# Patient Record
Sex: Female | Born: 1975 | Race: White | Hispanic: No | Smoking: Never smoker
Health system: Southern US, Community
[De-identification: ages and names within clinical notes are randomized; demographics above are authoritative.]

## PROBLEM LIST (undated history)

## (undated) DIAGNOSIS — J302 Other seasonal allergic rhinitis: Secondary | ICD-10-CM

## (undated) DIAGNOSIS — D649 Anemia, unspecified: Secondary | ICD-10-CM

## (undated) DIAGNOSIS — K219 Gastro-esophageal reflux disease without esophagitis: Secondary | ICD-10-CM

## (undated) DIAGNOSIS — Z8719 Personal history of other diseases of the digestive system: Secondary | ICD-10-CM

## (undated) DIAGNOSIS — Z973 Presence of spectacles and contact lenses: Secondary | ICD-10-CM

## (undated) DIAGNOSIS — D259 Leiomyoma of uterus, unspecified: Secondary | ICD-10-CM

## (undated) DIAGNOSIS — M1990 Unspecified osteoarthritis, unspecified site: Secondary | ICD-10-CM

## (undated) DIAGNOSIS — O021 Missed abortion: Secondary | ICD-10-CM

## (undated) DIAGNOSIS — K859 Acute pancreatitis without necrosis or infection, unspecified: Secondary | ICD-10-CM

## (undated) DIAGNOSIS — O039 Complete or unspecified spontaneous abortion without complication: Secondary | ICD-10-CM

## (undated) DIAGNOSIS — N809 Endometriosis, unspecified: Secondary | ICD-10-CM

## (undated) HISTORY — PX: ABDOMINAL HYSTERECTOMY: SHX81

---
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
## 1997-11-03 ENCOUNTER — Ambulatory Visit (HOSPITAL_COMMUNITY): Admission: RE | Admit: 1997-11-03 | Discharge: 1997-11-03 | Payer: Self-pay | Admitting: Internal Medicine

## 1998-08-26 ENCOUNTER — Ambulatory Visit (HOSPITAL_COMMUNITY): Admission: RE | Admit: 1998-08-26 | Discharge: 1998-08-26 | Payer: Self-pay | Admitting: Oral & Maxillofacial Surgery

## 1998-08-26 ENCOUNTER — Encounter: Payer: Self-pay | Admitting: Oral & Maxillofacial Surgery

## 2000-03-04 ENCOUNTER — Other Ambulatory Visit: Admission: RE | Admit: 2000-03-04 | Discharge: 2000-03-04 | Payer: Self-pay | Admitting: *Deleted

## 2000-11-18 ENCOUNTER — Emergency Department (HOSPITAL_COMMUNITY): Admission: EM | Admit: 2000-11-18 | Discharge: 2000-11-19 | Payer: Self-pay | Admitting: Internal Medicine

## 2001-06-16 ENCOUNTER — Other Ambulatory Visit: Admission: RE | Admit: 2001-06-16 | Discharge: 2001-06-16 | Payer: Self-pay | Admitting: Obstetrics and Gynecology

## 2002-06-16 ENCOUNTER — Other Ambulatory Visit: Admission: RE | Admit: 2002-06-16 | Discharge: 2002-06-16 | Payer: Self-pay | Admitting: Obstetrics and Gynecology

## 2003-06-17 ENCOUNTER — Other Ambulatory Visit: Admission: RE | Admit: 2003-06-17 | Discharge: 2003-06-17 | Payer: Self-pay | Admitting: Obstetrics and Gynecology

## 2004-08-03 ENCOUNTER — Other Ambulatory Visit: Admission: RE | Admit: 2004-08-03 | Discharge: 2004-08-03 | Payer: Self-pay | Admitting: Obstetrics and Gynecology

## 2004-10-05 ENCOUNTER — Emergency Department (HOSPITAL_COMMUNITY): Admission: EM | Admit: 2004-10-05 | Discharge: 2004-10-05 | Payer: Self-pay | Admitting: Emergency Medicine

## 2004-10-06 ENCOUNTER — Encounter: Admission: RE | Admit: 2004-10-06 | Discharge: 2004-10-06 | Payer: Self-pay | Admitting: Gastroenterology

## 2004-10-10 ENCOUNTER — Encounter: Admission: RE | Admit: 2004-10-10 | Discharge: 2004-10-10 | Payer: Self-pay | Admitting: Gastroenterology

## 2004-11-06 ENCOUNTER — Encounter (INDEPENDENT_AMBULATORY_CARE_PROVIDER_SITE_OTHER): Payer: Self-pay | Admitting: Specialist

## 2004-11-06 ENCOUNTER — Ambulatory Visit (HOSPITAL_COMMUNITY): Admission: RE | Admit: 2004-11-06 | Discharge: 2004-11-07 | Payer: Self-pay | Admitting: General Surgery

## 2004-11-06 HISTORY — PX: CHOLECYSTECTOMY: SHX55

## 2005-08-07 ENCOUNTER — Other Ambulatory Visit: Admission: RE | Admit: 2005-08-07 | Discharge: 2005-08-07 | Payer: Self-pay | Admitting: Obstetrics and Gynecology

## 2006-08-15 ENCOUNTER — Other Ambulatory Visit: Admission: RE | Admit: 2006-08-15 | Discharge: 2006-08-15 | Payer: Self-pay | Admitting: Obstetrics and Gynecology

## 2007-08-26 ENCOUNTER — Other Ambulatory Visit: Admission: RE | Admit: 2007-08-26 | Discharge: 2007-08-26 | Payer: Self-pay | Admitting: Obstetrics and Gynecology

## 2008-09-03 ENCOUNTER — Ambulatory Visit: Payer: Self-pay | Admitting: Obstetrics and Gynecology

## 2008-09-03 ENCOUNTER — Encounter: Payer: Self-pay | Admitting: Obstetrics and Gynecology

## 2008-09-03 ENCOUNTER — Other Ambulatory Visit: Admission: RE | Admit: 2008-09-03 | Discharge: 2008-09-03 | Payer: Self-pay | Admitting: Obstetrics and Gynecology

## 2009-09-19 ENCOUNTER — Ambulatory Visit: Payer: Self-pay | Admitting: Obstetrics and Gynecology

## 2009-09-19 ENCOUNTER — Other Ambulatory Visit: Admission: RE | Admit: 2009-09-19 | Discharge: 2009-09-19 | Payer: Self-pay | Admitting: Obstetrics and Gynecology

## 2010-02-27 ENCOUNTER — Ambulatory Visit: Payer: Self-pay | Admitting: Obstetrics and Gynecology

## 2010-06-04 ENCOUNTER — Encounter: Payer: Self-pay | Admitting: Gastroenterology

## 2010-08-07 ENCOUNTER — Emergency Department: Payer: Self-pay | Admitting: Unknown Physician Specialty

## 2010-09-21 ENCOUNTER — Other Ambulatory Visit (HOSPITAL_COMMUNITY)
Admission: RE | Admit: 2010-09-21 | Discharge: 2010-09-21 | Disposition: A | Discharge status: Home / Self Care | Payer: BC Managed Care – PPO | Source: Ambulatory Visit | Attending: Obstetrics and Gynecology | Admitting: Obstetrics and Gynecology

## 2010-09-21 ENCOUNTER — Other Ambulatory Visit: Payer: Self-pay | Admitting: Obstetrics and Gynecology

## 2010-09-21 ENCOUNTER — Encounter (INDEPENDENT_AMBULATORY_CARE_PROVIDER_SITE_OTHER): Payer: BC Managed Care – PPO | Admitting: Obstetrics and Gynecology

## 2010-09-21 DIAGNOSIS — Z01419 Encounter for gynecological examination (general) (routine) without abnormal findings: Secondary | ICD-10-CM

## 2010-09-21 DIAGNOSIS — Z124 Encounter for screening for malignant neoplasm of cervix: Secondary | ICD-10-CM | POA: Insufficient documentation

## 2010-09-29 NOTE — Op Note (Signed)
NAME:  Cathy, [name] NO.:  [id number]   MEDICAL RECORD NO.:  [id number]          PATIENT TYPE:  OIB   LOCATION:  2550                         FACILITY:  [facility]   PHYSICIAN:  Cherylynn Ridges, M.D.    DATE OF BIRTH:  1975-12-11   DATE OF PROCEDURE:  11/06/2004  DATE OF DISCHARGE:                                 OPERATIVE REPORT   PREOPERATIVE DIAGNOSIS:  Dysfunctional gallbladder with symptoms.   POSTOPERATIVE DIAGNOSES:  Dysfunctional gallbladder with symptoms with  adhesions to the right colon and right lower quadrant and normal appendix.   PROCEDURE:  Laparoscopic cholecystectomy with laparoscopic enterolysis.   SURGEON:  Cherylynn Ridges, M.D.   ASSISTANT:  Earney Hamburg, P.A.   ANESTHESIA:  General endotracheal.   ESTIMATED BLOOD LOSS:  Less than 20 cc.   COMPLICATIONS:  None.   CONDITION:  Good.   INDICATION FOR OPERATION:  The patient is a 35 year old with abdominal pain  in the epigastrium and right upper quadrant with an ejection fraction of 58%  who now comes in for laparoscopic cholecystectomy.   FINDINGS:  The patient had some filmy adhesions to the colon on the right  side.  The appendix was normal.  There were some adhesions to the  infundibulum of the gallbladder.  Cholangiogram was normal with no filling  defects and no dilatation.   DESCRIPTION OF OPERATION:  The patient was taken to the operating room and  placed on the table in the supine position.  After an adequate endotracheal  anesthetic was administered, she was prepped and draped in the usual sterile  manner, exposing the midline and the right upper quadrant.   The patient had had an umbilical piercing and had a hole in place from that.  This was in the superior aspect of her umbilicus.  We used an inferior  infraumbilical curvilinear incision down to the midline fascia.  It was  through that fascia that we entered the peritoneal cavity with Kocher clamps  and subsequently a  Hasson cannula.  This was secured in place with a purse-  string suture of 0 Vicryl.  Once that was in place two right costal margin 5-  mm cannulas and a subxiphoid cannula were passed under direct vision.  The  patient was placed in reversed Trendelenburg, the left side was tilted down  and dissection begun.   We were able to grasp the dome of the gallbladder and retract towards the  right upper quadrant and the anterior abdominal wall.  A dissecting grasper  was passed onto the infundibulum.  We were able to dissect out the perineum  overlying the triangle of Calot and the hepatoduodenal triangle.  This  exposed the cystic duct and the cystic artery.  A clip was placed along the  gallbladder side of the cystic duct and subsequently a cholecystoductotomy  made using laparoscopic scissors.  With this being done, a Cook catheter  which had been passed through the anterior abdominal wall was passed into  the cholecystoductotomy, clipped in place and then subsequently a  cholangiogram was performed which showed  normal filling of the biliary tree,  good emptying into the duodenum and good proximal flow.  There were no  filling defects.   Once the cholangiogram was completed, we removed the [facility] catheter and the  Endoclip.  We doubly clipped the cystic duct distally and transected it.  The cystic artery with the cystic duct was clipped proximally x2 and then  distally x1 and transected.  We then dissected out the gallbladder from the  hepatic bed with minimal difficulty and minimal bleeding.  Once this was  done, we were able to bring it out through the supraumbilical site with no  difficulty.  We closed the fascia at that site using the purse-string suture  of 0 Vicryl.  Once that was done, we inspected the bed for bleeding and bile  staining and none was noted.  We aspirated all gas and fluid from the  peritoneal cavity and removed all cannulas.   0.25% Marcaine with epinephrine was  injected at all sites and the skin was  closed using a running, subcuticular stitch of 4-0 Vicryl.  All needle  counts, sponge counts and instrument counts were correct, and sterile  dressings were applied.       JOW/MEDQ  D:  11/06/2004  T:  11/06/2004  Job:  [id number]

## 2011-12-12 ENCOUNTER — Encounter (HOSPITAL_COMMUNITY): Payer: Self-pay

## 2011-12-12 ENCOUNTER — Inpatient Hospital Stay (HOSPITAL_COMMUNITY)
Admission: AD | Admit: 2011-12-12 | Discharge: 2011-12-12 | Disposition: A | Discharge status: Home / Self Care | Payer: 59 | Source: Ambulatory Visit | Attending: Obstetrics and Gynecology | Admitting: Obstetrics and Gynecology

## 2011-12-12 DIAGNOSIS — IMO0002 Reserved for concepts with insufficient information to code with codable children: Secondary | ICD-10-CM

## 2011-12-12 DIAGNOSIS — O139 Gestational [pregnancy-induced] hypertension without significant proteinuria, unspecified trimester: Secondary | ICD-10-CM | POA: Insufficient documentation

## 2011-12-12 LAB — CBC
MCH: 32.8 pg (ref 26.0–34.0)
MCV: 97.6 fL (ref 78.0–100.0)
Platelets: 201 10*3/uL (ref 150–400)
RDW: 13.2 % (ref 11.5–15.5)
WBC: 7.3 10*3/uL (ref 4.0–10.5)

## 2011-12-12 LAB — URINALYSIS, ROUTINE W REFLEX MICROSCOPIC
Glucose, UA: NEGATIVE mg/dL
Ketones, ur: NEGATIVE mg/dL
Leukocytes, UA: NEGATIVE
Nitrite: NEGATIVE
Specific Gravity, Urine: >1.03 — ABNORMAL HIGH (ref 1.005–1.030)
pH: 6 (ref 5.0–8.0)

## 2011-12-12 LAB — OB RESULTS CONSOLE HIV ANTIBODY (ROUTINE TESTING): HIV: NONREACTIVE

## 2011-12-12 LAB — COMPREHENSIVE METABOLIC PANEL
ALT: 22 U/L (ref 0–35)
AST: 29 U/L (ref 0–37)
Albumin: 2.7 g/dL — ABNORMAL LOW (ref 3.5–5.2)
Calcium: 8.8 mg/dL (ref 8.4–10.5)
Creatinine, Ser: 0.77 mg/dL (ref 0.50–1.10)
Sodium: 133 mEq/L — ABNORMAL LOW (ref 135–145)

## 2011-12-12 LAB — OB RESULTS CONSOLE GC/CHLAMYDIA
Chlamydia: NEGATIVE
Gonorrhea: NEGATIVE

## 2011-12-12 LAB — OB RESULTS CONSOLE HEPATITIS B SURFACE ANTIGEN: Hepatitis B Surface Ag: NEGATIVE

## 2011-12-12 LAB — OB RESULTS CONSOLE ABO/RH: RH Type: POSITIVE

## 2011-12-12 LAB — OB RESULTS CONSOLE RUBELLA ANTIBODY, IGM: Rubella: IMMUNE

## 2011-12-12 LAB — LACTATE DEHYDROGENASE: LDH: 249 U/L (ref 94–250)

## 2011-12-12 MED ORDER — GI COCKTAIL ~~LOC~~
30.0000 mL | Freq: Once | ORAL | Status: AC
  Administered 2011-12-12: 30 mL via ORAL
  Filled 2011-12-12: qty 30

## 2011-12-12 MED ORDER — RANITIDINE HCL 150 MG PO TABS: 150.0000 mg | ORAL_TABLET | Freq: Two times a day (BID) | ORAL | Status: DC

## 2011-12-12 NOTE — MAU Note (Signed)
Patient states she had a regular office visit today and her blood pressure was elevated,. Patient states she was seeing spots on 7-28 but none since. Had epigastric tenderness on palpation in the office and has been having increased swelling. Denies any bleeding or leaking and reports good fetal movement.

## 2011-12-12 NOTE — MAU Provider Note (Signed)
History     CSN: [id number]  Arrival date and time: 12/12/11 1725   First Provider Initiated Contact with Patient 12/12/11 1835      Chief Complaint  Patient presents with  . Hypertension   HPI This is a 36 year old G1 P0 at 30 weeks and 5 days who was sent to the MAU from [facility] office due to elevated blood pressure and proteinuria.  She's had had a couple days ago as well as scotoma. She also has mild epigastric pain. She does have swelling in her legs. She denies contractions, decreased fetal activity, leaking fluid, vaginal discharge.  OB History    Grav Para Term Preterm Abortions TAB SAB Ect Mult Living   1 0 0 0 0 0 0 0 0 0       History reviewed. No pertinent past medical history.  Past Surgical History  Procedure Date  . Cholecystectomy 2006  . Laparoscopic endometriosis fulguration 1997    No family history on file.  History  Substance Use Topics  . Smoking status: Not on file  . Smokeless tobacco: Not on file  . Alcohol Use:     Allergies:  Allergies  Allergen Reactions  . Sulfa Antibiotics Nausea And Vomiting    Prescriptions prior to admission  Medication Sig Dispense Refill  . Multiple Vitamin (MULTIVITAMIN) tablet Take 1 tablet by mouth daily.      . Pediatric Multiple Vit-C-FA (FLINSTONES GUMMIES OMEGA-3 DHA) CHEW Chew by mouth.        ROS Physical Exam   Blood pressure 136/79, pulse 86, temperature 99.2 F (37.3 C), temperature source Oral, resp. rate 18, height 5\' 3"  (1.6 m), weight 82.101 kg (181 lb), SpO2 99.00%.  Physical Exam  Constitutional: She is oriented to person, place, and time. She appears well-developed and well-nourished.  HENT:  Head: Normocephalic and atraumatic.  GI: Soft. She exhibits no distension and no mass. There is no rebound and no guarding.       Fundal height at term. Mild epigastric tenderness  Musculoskeletal: She exhibits edema (2+ pitting edema enlarged ovaries.).  Neurological: She is alert and oriented to  person, place, and time.       Two beat clonus bilaterally.  Skin: Skin is warm and dry.  Psychiatric: She has a normal mood and affect. Her behavior is normal. Judgment and thought content normal.   Results for orders placed during the hospital encounter of 12/12/11 (from the past 24 hour(s))  OB RESULTS CONSOLE GBS     Status: Normal      Component Value Range   GBS Positive    OB RESULTS CONSOLE GC/CHLAMYDIA     Status: Normal      Component Value Range   Gonorrhea Negative     Chlamydia Negative    OB RESULTS CONSOLE RPR     Status: Normal      Component Value Range   RPR Nonreactive    OB RESULTS CONSOLE HIV ANTIBODY (ROUTINE TESTING)     Status: Normal      Component Value Range   HIV Non-reactive    OB RESULTS CONSOLE RUBELLA ANTIBODY, IGM     Status: Normal      Component Value Range   Rubella Immune    OB RESULTS CONSOLE HEPATITIS B SURFACE ANTIGEN     Status: Normal      Component Value Range   Hepatitis B Surface Ag Negative    OB RESULTS CONSOLE ABO/RH     Status: Normal  Component Value Range   RH Type  Positive     ABO Grouping O    URINALYSIS, ROUTINE W REFLEX MICROSCOPIC     Status: Abnormal   Collection Time   12/12/11  5:50 PM      Component Value Range   Color, Urine YELLOW  YELLOW   APPearance CLEAR  CLEAR   Specific Gravity, Urine >1.030 (*) 1.005 - 1.030   pH 6.0  5.0 - 8.0   Glucose, UA NEGATIVE  NEGATIVE mg/dL   Hgb urine dipstick NEGATIVE  NEGATIVE   Bilirubin Urine NEGATIVE  NEGATIVE   Ketones, ur NEGATIVE  NEGATIVE mg/dL   Protein, ur NEGATIVE  NEGATIVE mg/dL   Urobilinogen, UA 0.2  0.0 - 1.0 mg/dL   Nitrite NEGATIVE  NEGATIVE   Leukocytes, UA NEGATIVE  NEGATIVE  CBC     Status: Abnormal   Collection Time   12/12/11  6:40 PM      Component Value Range   WBC 7.3  4.0 - 10.5 K/uL   RBC 3.69 (*) 3.87 - 5.11 MIL/uL   Hemoglobin 12.1  12.0 - 15.0 g/dL   HCT 16.1  09.6 - 04.5 %   MCV 97.6  78.0 - 100.0 fL   MCH 32.8  26.0 - 34.0 pg   MCHC  33.6  30.0 - 36.0 g/dL   RDW 40.9  81.1 - 91.4 %   Platelets 201  150 - 400 K/uL  COMPREHENSIVE METABOLIC PANEL     Status: Abnormal   Collection Time   12/12/11  6:40 PM      Component Value Range   Sodium 133 (*) 135 - 145 mEq/L   Potassium 3.6  3.5 - 5.1 mEq/L   Chloride 101  96 - 112 mEq/L   CO2 22  19 - 32 mEq/L   Glucose, Bld 89  70 - 99 mg/dL   BUN 9  6 - 23 mg/dL   Creatinine, Ser 7.82  0.50 - 1.10 mg/dL   Calcium 8.8  8.4 - 95.6 mg/dL   Total Protein 6.5  6.0 - 8.3 g/dL   Albumin 2.7 (*) 3.5 - 5.2 g/dL   AST 29  0 - 37 U/L   ALT 22  0 - 35 U/L   Alkaline Phosphatase 276 (*) 39 - 117 U/L   Total Bilirubin 0.3  0.3 - 1.2 mg/dL   GFR calc non Af Amer >90  >90 mL/min   GFR calc Af Amer >90  >90 mL/min  URIC ACID     Status: Normal   Collection Time   12/12/11  6:40 PM      Component Value Range   Uric Acid, Serum 4.4  2.4 - 7.0 mg/dL  LACTATE DEHYDROGENASE     Status: Normal   Collection Time   12/12/11  6:40 PM      Component Value Range   LDH 249  94 - 250 U/L    MAU Course  Procedures NST shows category 1 tracing with multiple accelerations. MDM Labs negative for frequency.  Assessment and Plan  #1 gestational hypertension.  Discuss with Dr. Rana Snare negative labs. Patient to followup at [facility] office on Friday for blood pressure check, ultrasound, NST. Preeclampsia signs and symptoms given. Patient discharged home  Cathy [name] JEHIEL 12/12/2011, 6:48 PM

## 2011-12-12 NOTE — MAU Note (Signed)
Pt presents for preeclampsia evaluation after at [facility] with recent c/o headache, dizziness and blurry vision on Sunday.  Pt with epigastic pain with fetal movement and when palpated by MD today. Pt also with acid refulx and heartburn unrelieved by tums.

## 2011-12-16 ENCOUNTER — Inpatient Hospital Stay (HOSPITAL_COMMUNITY)
Admission: AD | Admit: 2011-12-16 | Discharge: 2011-12-19 | DRG: 766 | Disposition: A | Discharge status: Home / Self Care | Payer: 59 | Source: Ambulatory Visit | Attending: Obstetrics and Gynecology | Admitting: Obstetrics and Gynecology

## 2011-12-16 ENCOUNTER — Encounter (HOSPITAL_COMMUNITY): Payer: Self-pay | Admitting: *Deleted

## 2011-12-16 DIAGNOSIS — O99892 Other specified diseases and conditions complicating childbirth: Secondary | ICD-10-CM | POA: Diagnosis present

## 2011-12-16 DIAGNOSIS — O139 Gestational [pregnancy-induced] hypertension without significant proteinuria, unspecified trimester: Principal | ICD-10-CM | POA: Diagnosis present

## 2011-12-16 DIAGNOSIS — Z22330 Carrier of Group B streptococcus: Secondary | ICD-10-CM

## 2011-12-16 DIAGNOSIS — O26899 Other specified pregnancy related conditions, unspecified trimester: Secondary | ICD-10-CM

## 2011-12-16 LAB — COMPREHENSIVE METABOLIC PANEL
AST: 22 U/L (ref 0–37)
Albumin: 2.5 g/dL — ABNORMAL LOW (ref 3.5–5.2)
Calcium: 8.8 mg/dL (ref 8.4–10.5)
Chloride: 103 mEq/L (ref 96–112)
Creatinine, Ser: 0.75 mg/dL (ref 0.50–1.10)
Sodium: 135 mEq/L (ref 135–145)
Total Bilirubin: 0.2 mg/dL — ABNORMAL LOW (ref 0.3–1.2)

## 2011-12-16 LAB — URINALYSIS, ROUTINE W REFLEX MICROSCOPIC
Bilirubin Urine: NEGATIVE
Leukocytes, UA: NEGATIVE
Nitrite: NEGATIVE
Specific Gravity, Urine: <1.005 — ABNORMAL LOW (ref 1.005–1.030)
Urobilinogen, UA: 0.2 mg/dL (ref 0.0–1.0)

## 2011-12-16 LAB — CBC
MCH: 33 pg (ref 26.0–34.0)
Platelets: 190 10*3/uL (ref 150–400)
RBC: 3.67 MIL/uL — ABNORMAL LOW (ref 3.87–5.11)

## 2011-12-16 LAB — LACTATE DEHYDROGENASE: LDH: 212 U/L (ref 94–250)

## 2011-12-16 MED ORDER — OXYCODONE-ACETAMINOPHEN 5-325 MG PO TABS
2.0000 | ORAL_TABLET | Freq: Once | ORAL | Status: AC
  Administered 2011-12-16: 2 via ORAL
  Filled 2011-12-16: qty 2

## 2011-12-16 MED ORDER — GI COCKTAIL ~~LOC~~
30.0000 mL | Freq: Once | ORAL | Status: AC
  Administered 2011-12-16: 30 mL via ORAL
  Filled 2011-12-16: qty 30

## 2011-12-16 NOTE — MAU Provider Note (Signed)
History     CSN: [id number]  Arrival date and time: 12/16/11 8295   First Provider Initiated Contact with Patient 12/16/11 2023      Chief Complaint  Patient presents with  . Decreased Fetal Movement   HPI Cathy [name] is a 36 y.o. female @ [redacted]w[redacted]d gestation who presents to MAU for elevated blood pressure. Patient states that when she was in the office last week her pressure was up and sent to MAU and had PIH labs that were ok so she went home. Returned to the office 2 days ago and BP was still elevated. Told possible mild PIH. Scheduled for induction this week. Patient has been on bedrest. Today she has had nausea, headache and pain in abdomen, BP continued to be elevated. Patient has felt like fetal movement is decreased. The history was provided by the patient.  OB History    Grav Para Term Preterm Abortions TAB SAB Ect Mult Living   1 0 0 0 0 0 0 0 0 0       No past medical history on file.  Past Surgical History  Procedure Date  . Cholecystectomy 2006  . Laparoscopic endometriosis fulguration 1997    No family history on file.  History  Substance Use Topics  . Smoking status: Not on file  . Smokeless tobacco: Not on file  . Alcohol Use: No    Allergies:  Allergies  Allergen Reactions  . Sulfa Antibiotics Nausea And Vomiting    Prescriptions prior to admission  Medication Sig Dispense Refill  . Multiple Vitamin (MULTIVITAMIN) tablet Take 1 tablet by mouth daily.      . Pediatric Multiple Vit-C-FA (FLINSTONES GUMMIES OMEGA-3 DHA) CHEW Chew by mouth.      . ranitidine (ZANTAC) 150 MG tablet Take 1 tablet (150 mg total) by mouth 2 (two) times daily.  60 tablet  0    Review of Systems  Constitutional: Negative for fever, chills and weight loss.  HENT: Positive for ear pain. Negative for nosebleeds, congestion, sore throat and neck pain.   Eyes: Negative for blurred vision, double vision, photophobia and pain.  Respiratory: Negative for cough, shortness of breath  and wheezing.   Cardiovascular: Positive for leg swelling. Negative for chest pain and palpitations.  Gastrointestinal: Positive for heartburn, nausea and abdominal pain. Negative for vomiting, diarrhea and constipation.  Genitourinary: Negative for dysuria, urgency and frequency.  Musculoskeletal: Positive for myalgias and back pain.  Skin: Negative for itching and rash.  Neurological: Positive for dizziness and headaches. Negative for sensory change, speech change, seizures and weakness.  Endo/Heme/Allergies: Does not bruise/bleed easily.  Psychiatric/Behavioral: Negative for depression. The patient is not nervous/anxious and does not have insomnia.    Blood pressure 134/90, pulse 89, temperature 97.2 F (36.2 C), temperature source Oral, resp. rate 20, height 5\' 3"  (1.6 m), weight 181 lb (82.101 kg).  Physical Exam  Nursing note and vitals reviewed. Constitutional: She is oriented to person, place, and time. She appears well-developed and well-nourished. No distress.       Blood pressure elevated on arrival.  HENT:  Head: Normocephalic and atraumatic.  Eyes: EOM are normal.  Neck: Neck supple.  Cardiovascular: Normal rate.   Respiratory: Effort normal.  GI: Soft. There is tenderness in the right upper quadrant. There is no rebound, no guarding and no CVA tenderness.       Gravid, consistent with dates. Mildly tender at umbilicus with palpation.    Genitourinary: Vagina normal.  Musculoskeletal: Normal range  of motion.  Neurological: She is alert and oriented to person, place, and time.  Skin: Skin is warm and dry.  Psychiatric: She has a normal mood and affect. Her behavior is normal. Judgment and thought content normal.   Results for orders placed during the hospital encounter of 12/16/11 (from the past 24 hour(s))  URINALYSIS, ROUTINE W REFLEX MICROSCOPIC     Status: Abnormal   Collection Time   12/16/11  7:45 PM      Component Value Range   Color, Urine YELLOW  YELLOW    APPearance CLEAR  CLEAR   Specific Gravity, Urine <1.005 (*) 1.005 - 1.030   pH 6.0  5.0 - 8.0   Glucose, UA NEGATIVE  NEGATIVE mg/dL   Hgb urine dipstick NEGATIVE  NEGATIVE   Bilirubin Urine NEGATIVE  NEGATIVE   Ketones, ur NEGATIVE  NEGATIVE mg/dL   Protein, ur NEGATIVE  NEGATIVE mg/dL   Urobilinogen, UA 0.2  0.0 - 1.0 mg/dL   Nitrite NEGATIVE  NEGATIVE   Leukocytes, UA NEGATIVE  NEGATIVE  CBC     Status: Abnormal   Collection Time   12/16/11  8:03 PM      Component Value Range   WBC 7.4  4.0 - 10.5 K/uL   RBC 3.67 (*) 3.87 - 5.11 MIL/uL   Hemoglobin 12.1  12.0 - 15.0 g/dL   HCT 16.1 (*) 09.6 - 04.5 %   MCV 97.5  78.0 - 100.0 fL   MCH 33.0  26.0 - 34.0 pg   MCHC 33.8  30.0 - 36.0 g/dL   RDW 40.9  81.1 - 91.4 %   Platelets 190  150 - 400 K/uL  COMPREHENSIVE METABOLIC PANEL     Status: Abnormal   Collection Time   12/16/11  8:03 PM      Component Value Range   Sodium 135  135 - 145 mEq/L   Potassium 3.7  3.5 - 5.1 mEq/L   Chloride 103  96 - 112 mEq/L   CO2 21  19 - 32 mEq/L   Glucose, Bld 82  70 - 99 mg/dL   BUN 7  6 - 23 mg/dL   Creatinine, Ser 7.82  0.50 - 1.10 mg/dL   Calcium 8.8  8.4 - 95.6 mg/dL   Total Protein 6.2  6.0 - 8.3 g/dL   Albumin 2.5 (*) 3.5 - 5.2 g/dL   AST 22  0 - 37 U/L   ALT 16  0 - 35 U/L   Alkaline Phosphatase 264 (*) 39 - 117 U/L   Total Bilirubin 0.2 (*) 0.3 - 1.2 mg/dL   GFR calc non Af Amer >90  >90 mL/min   GFR calc Af Amer >90  >90 mL/min  URIC ACID     Status: Normal   Collection Time   12/16/11  8:03 PM      Component Value Range   Uric Acid, Serum 4.7  2.4 - 7.0 mg/dL  LACTATE DEHYDROGENASE     Status: Normal   Collection Time   12/16/11  8:03 PM      Component Value Range   LDH 212  94 - 250 U/L   EFM: Baseline initially 150, Reactive tracing, uterine irritability,   MAU Course: Dr. Marcelle Overlie gave [facility] lab orders.   Procedures Medical screening exam complete and patient stable for continued care by Dr. Marcelle Overlie  Dr. Marcelle Overlie order GI  Cocktail for patient.  NEESE,HOPE, RN, FNP, [facility] 12/16/2011, 8:24 PM   @ 23:45 pm Dr.  [place] notified by RN that patient continues to have headache and abdominal pain. He will write admit orders and patient will go to Labor/Birth unit.

## 2011-12-16 NOTE — MAU Note (Signed)
Pt reports "my b/p is still up" , states elevated b/p in office since Wednesday. States baby is not moving as much and she just doesn';t feel well.

## 2011-12-16 NOTE — MAU Note (Signed)
"  I feel like crap".

## 2011-12-16 NOTE — MAU Note (Signed)
Sent in by MD for BP check. BP started rising past Wed at MD office. Now with RUQ pain and nausea without vomiting. Generally fatigued. Mild frontal HA , no visual disturbances now but did have previously.

## 2011-12-16 NOTE — MAU Note (Signed)
Pt states HA worsening and nausea and RUQ pain worsening as well.

## 2011-12-17 ENCOUNTER — Inpatient Hospital Stay (HOSPITAL_COMMUNITY): Payer: 59 | Admitting: Anesthesiology

## 2011-12-17 ENCOUNTER — Encounter (HOSPITAL_COMMUNITY): Payer: Self-pay | Admitting: *Deleted

## 2011-12-17 ENCOUNTER — Encounter (HOSPITAL_COMMUNITY): Payer: Self-pay | Admitting: Anesthesiology

## 2011-12-17 ENCOUNTER — Encounter (HOSPITAL_COMMUNITY)
Admission: AD | Disposition: A | Discharge status: Home / Self Care | Payer: Self-pay | Source: Ambulatory Visit | Attending: Obstetrics and Gynecology

## 2011-12-17 LAB — RPR: RPR Ser Ql: NONREACTIVE

## 2011-12-17 SURGERY — Surgical Case
Anesthesia: Regional

## 2011-12-17 MED ORDER — TERBUTALINE SULFATE 1 MG/ML IJ SOLN: 0.2500 mg | Freq: Once | INTRAMUSCULAR | Status: DC | PRN

## 2011-12-17 MED ORDER — SODIUM CHLORIDE 0.9 % IV SOLN
1.0000 ug/kg/h | INTRAVENOUS | Status: DC | PRN
  Filled 2011-12-17: qty 2.5

## 2011-12-17 MED ORDER — ONDANSETRON HCL 4 MG/2ML IJ SOLN: 4.0000 mg | INTRAMUSCULAR | Status: DC | PRN

## 2011-12-17 MED ORDER — TETANUS-DIPHTH-ACELL PERTUSSIS 5-2.5-18.5 LF-MCG/0.5 IM SUSP: 0.5000 mL | Freq: Once | INTRAMUSCULAR | Status: DC

## 2011-12-17 MED ORDER — NALBUPHINE SYRINGE 5 MG/0.5 ML
5.0000 mg | INJECTION | INTRAMUSCULAR | Status: DC | PRN
  Filled 2011-12-17: qty 1

## 2011-12-17 MED ORDER — PENICILLIN G POTASSIUM 5000000 UNITS IJ SOLR
2.5000 10*6.[IU] | INTRAVENOUS | Status: DC
  Filled 2011-12-17 (×4): qty 2.5

## 2011-12-17 MED ORDER — LACTATED RINGERS IV SOLN
INTRAVENOUS | Status: DC
  Administered 2011-12-17 (×3): via INTRAVENOUS

## 2011-12-17 MED ORDER — DIPHENHYDRAMINE HCL 25 MG PO CAPS: 25.0000 mg | ORAL_CAPSULE | Freq: Four times a day (QID) | ORAL | Status: DC | PRN

## 2011-12-17 MED ORDER — IBUPROFEN 600 MG PO TABS
600.0000 mg | ORAL_TABLET | Freq: Four times a day (QID) | ORAL | Status: DC
  Administered 2011-12-17 – 2011-12-19 (×7): 600 mg via ORAL
  Filled 2011-12-17 (×3): qty 1

## 2011-12-17 MED ORDER — KETOROLAC TROMETHAMINE 30 MG/ML IJ SOLN
30.0000 mg | Freq: Four times a day (QID) | INTRAMUSCULAR | Status: AC | PRN
  Administered 2011-12-17: 30 mg via INTRAMUSCULAR

## 2011-12-17 MED ORDER — ONDANSETRON HCL 4 MG/2ML IJ SOLN
INTRAMUSCULAR | Status: AC
  Filled 2011-12-17: qty 2

## 2011-12-17 MED ORDER — FENTANYL CITRATE 0.05 MG/ML IJ SOLN
INTRAMUSCULAR | Status: DC | PRN
  Administered 2011-12-17: 25 ug via INTRATHECAL

## 2011-12-17 MED ORDER — PRENATAL MULTIVITAMIN CH
1.0000 | ORAL_TABLET | Freq: Every day | ORAL | Status: DC
  Administered 2011-12-17 – 2011-12-19 (×3): 1 via ORAL
  Filled 2011-12-17 (×3): qty 1

## 2011-12-17 MED ORDER — SENNOSIDES-DOCUSATE SODIUM 8.6-50 MG PO TABS
2.0000 | ORAL_TABLET | Freq: Every day | ORAL | Status: DC
  Administered 2011-12-17 – 2011-12-18 (×2): 2 via ORAL

## 2011-12-17 MED ORDER — LACTATED RINGERS IV SOLN
500.0000 mL | INTRAVENOUS | Status: DC | PRN
  Administered 2011-12-17: 500 mL via INTRAVENOUS

## 2011-12-17 MED ORDER — SIMETHICONE 80 MG PO CHEW
80.0000 mg | CHEWABLE_TABLET | Freq: Three times a day (TID) | ORAL | Status: DC
  Administered 2011-12-17 – 2011-12-19 (×7): 80 mg via ORAL

## 2011-12-17 MED ORDER — ONDANSETRON HCL 4 MG/2ML IJ SOLN: 4.0000 mg | Freq: Three times a day (TID) | INTRAMUSCULAR | Status: DC | PRN

## 2011-12-17 MED ORDER — MEPERIDINE HCL 25 MG/ML IJ SOLN: 6.2500 mg | INTRAMUSCULAR | Status: DC | PRN

## 2011-12-17 MED ORDER — NALOXONE HCL 0.4 MG/ML IJ SOLN: 0.4000 mg | INTRAMUSCULAR | Status: DC | PRN

## 2011-12-17 MED ORDER — LACTATED RINGERS IV SOLN
INTRAVENOUS | Status: DC | PRN
  Administered 2011-12-17: 11:00:00 via INTRAVENOUS

## 2011-12-17 MED ORDER — METOCLOPRAMIDE HCL 5 MG/ML IJ SOLN: 10.0000 mg | Freq: Three times a day (TID) | INTRAMUSCULAR | Status: DC | PRN

## 2011-12-17 MED ORDER — ZOLPIDEM TARTRATE 5 MG PO TABS: 5.0000 mg | ORAL_TABLET | Freq: Every evening | ORAL | Status: DC | PRN

## 2011-12-17 MED ORDER — MORPHINE SULFATE 0.5 MG/ML IJ SOLN
INTRAMUSCULAR | Status: AC
  Filled 2011-12-17: qty 10

## 2011-12-17 MED ORDER — OXYTOCIN BOLUS FROM INFUSION
250.0000 mL | Freq: Once | INTRAVENOUS | Status: DC
  Filled 2011-12-17: qty 500

## 2011-12-17 MED ORDER — CITRIC ACID-SODIUM CITRATE 334-500 MG/5ML PO SOLN
30.0000 mL | ORAL | Status: DC | PRN
  Administered 2011-12-17: 30 mL via ORAL
  Filled 2011-12-17: qty 15

## 2011-12-17 MED ORDER — OXYTOCIN 40 UNITS IN LACTATED RINGERS INFUSION - SIMPLE MED: 62.5000 mL/h | Freq: Once | INTRAVENOUS | Status: DC

## 2011-12-17 MED ORDER — MORPHINE SULFATE (PF) 0.5 MG/ML IJ SOLN
INTRAMUSCULAR | Status: DC | PRN
  Administered 2011-12-17: .5 mg via INTRATHECAL

## 2011-12-17 MED ORDER — ONDANSETRON HCL 4 MG/2ML IJ SOLN: 4.0000 mg | Freq: Four times a day (QID) | INTRAMUSCULAR | Status: DC | PRN

## 2011-12-17 MED ORDER — FLEET ENEMA 7-19 GM/118ML RE ENEM: 1.0000 | ENEMA | RECTAL | Status: DC | PRN

## 2011-12-17 MED ORDER — SODIUM CHLORIDE 0.9 % IJ SOLN: 3.0000 mL | Freq: Two times a day (BID) | INTRAMUSCULAR | Status: DC

## 2011-12-17 MED ORDER — NALBUPHINE SYRINGE 5 MG/0.5 ML
5.0000 mg | INJECTION | INTRAMUSCULAR | Status: DC | PRN
  Administered 2011-12-17: 10 mg via SUBCUTANEOUS
  Filled 2011-12-17: qty 1

## 2011-12-17 MED ORDER — KETOROLAC TROMETHAMINE 30 MG/ML IJ SOLN: 30.0000 mg | Freq: Four times a day (QID) | INTRAMUSCULAR | Status: AC | PRN

## 2011-12-17 MED ORDER — OXYCODONE-ACETAMINOPHEN 5-325 MG PO TABS
1.0000 | ORAL_TABLET | ORAL | Status: DC | PRN
  Administered 2011-12-18: 2 via ORAL
  Administered 2011-12-18 (×3): 1 via ORAL
  Filled 2011-12-17 (×3): qty 1
  Filled 2011-12-17 (×2): qty 2
  Filled 2011-12-17 (×2): qty 1

## 2011-12-17 MED ORDER — SODIUM CHLORIDE 0.9 % IJ SOLN: 3.0000 mL | INTRAMUSCULAR | Status: DC | PRN

## 2011-12-17 MED ORDER — WITCH HAZEL-GLYCERIN EX PADS
1.0000 "application " | MEDICATED_PAD | CUTANEOUS | Status: DC | PRN
  Administered 2011-12-17: 1 via TOPICAL

## 2011-12-17 MED ORDER — DIPHENHYDRAMINE HCL 50 MG/ML IJ SOLN: 12.5000 mg | INTRAMUSCULAR | Status: DC | PRN

## 2011-12-17 MED ORDER — MISOPROSTOL 25 MCG QUARTER TABLET
25.0000 ug | ORAL_TABLET | ORAL | Status: DC | PRN
  Administered 2011-12-17: 25 ug via VAGINAL
  Filled 2011-12-17: qty 0.25

## 2011-12-17 MED ORDER — FLEET ENEMA 7-19 GM/118ML RE ENEM: 1.0000 | ENEMA | Freq: Every day | RECTAL | Status: DC | PRN

## 2011-12-17 MED ORDER — KETOROLAC TROMETHAMINE 30 MG/ML IJ SOLN
INTRAMUSCULAR | Status: AC
  Filled 2011-12-17: qty 1

## 2011-12-17 MED ORDER — OXYTOCIN 10 UNIT/ML IJ SOLN
INTRAMUSCULAR | Status: AC
  Filled 2011-12-17: qty 4

## 2011-12-17 MED ORDER — LIDOCAINE HCL (PF) 1 % IJ SOLN: 30.0000 mL | INTRAMUSCULAR | Status: DC | PRN

## 2011-12-17 MED ORDER — OXYTOCIN 10 UNIT/ML IJ SOLN
40.0000 [IU] | INTRAVENOUS | Status: DC | PRN
  Administered 2011-12-17: 40 [IU] via INTRAVENOUS

## 2011-12-17 MED ORDER — OXYTOCIN 40 UNITS IN LACTATED RINGERS INFUSION - SIMPLE MED: 62.5000 mL/h | INTRAVENOUS | Status: AC

## 2011-12-17 MED ORDER — SCOPOLAMINE 1 MG/3DAYS TD PT72: 1.0000 | MEDICATED_PATCH | Freq: Once | TRANSDERMAL | Status: DC

## 2011-12-17 MED ORDER — ONDANSETRON HCL 4 MG/2ML IJ SOLN
INTRAMUSCULAR | Status: DC | PRN
  Administered 2011-12-17: 4 mg via INTRAVENOUS

## 2011-12-17 MED ORDER — ACETAMINOPHEN 325 MG PO TABS: 650.0000 mg | ORAL_TABLET | ORAL | Status: DC | PRN

## 2011-12-17 MED ORDER — DIPHENHYDRAMINE HCL 50 MG/ML IJ SOLN: 25.0000 mg | INTRAMUSCULAR | Status: DC | PRN

## 2011-12-17 MED ORDER — FENTANYL CITRATE 0.05 MG/ML IJ SOLN
INTRAMUSCULAR | Status: AC
  Filled 2011-12-17: qty 2

## 2011-12-17 MED ORDER — NALBUPHINE SYRINGE 5 MG/0.5 ML
INJECTION | INTRAMUSCULAR | Status: AC
  Filled 2011-12-17: qty 1

## 2011-12-17 MED ORDER — MENTHOL 3 MG MT LOZG: 1.0000 | LOZENGE | OROMUCOSAL | Status: DC | PRN

## 2011-12-17 MED ORDER — FENTANYL CITRATE 0.05 MG/ML IJ SOLN: 25.0000 ug | INTRAMUSCULAR | Status: DC | PRN

## 2011-12-17 MED ORDER — SIMETHICONE 80 MG PO CHEW: 80.0000 mg | CHEWABLE_TABLET | ORAL | Status: DC | PRN

## 2011-12-17 MED ORDER — CEFAZOLIN SODIUM 1-5 GM-% IV SOLN
INTRAVENOUS | Status: DC | PRN
  Administered 2011-12-17: 2 g via INTRAVENOUS

## 2011-12-17 MED ORDER — BUPIVACAINE IN DEXTROSE 0.75-8.25 % IT SOLN
INTRATHECAL | Status: DC | PRN
  Administered 2011-12-17: 1.6 mL via INTRATHECAL

## 2011-12-17 MED ORDER — DIBUCAINE 1 % RE OINT
1.0000 "application " | TOPICAL_OINTMENT | RECTAL | Status: DC | PRN
  Administered 2011-12-17: 1 via RECTAL
  Filled 2011-12-17: qty 28

## 2011-12-17 MED ORDER — LANOLIN HYDROUS EX OINT: 1.0000 "application " | TOPICAL_OINTMENT | CUTANEOUS | Status: DC | PRN

## 2011-12-17 MED ORDER — BISACODYL 10 MG RE SUPP: 10.0000 mg | Freq: Every day | RECTAL | Status: DC | PRN

## 2011-12-17 MED ORDER — PENICILLIN G POTASSIUM 5000000 UNITS IJ SOLR
5.0000 10*6.[IU] | Freq: Once | INTRAMUSCULAR | Status: AC
  Administered 2011-12-17: 5 10*6.[IU] via INTRAVENOUS
  Filled 2011-12-17: qty 5

## 2011-12-17 MED ORDER — OXYCODONE-ACETAMINOPHEN 5-325 MG PO TABS: 1.0000 | ORAL_TABLET | ORAL | Status: DC | PRN

## 2011-12-17 MED ORDER — LACTATED RINGERS IV SOLN
INTRAVENOUS | Status: DC
  Administered 2011-12-17: 21:00:00 via INTRAVENOUS

## 2011-12-17 MED ORDER — SODIUM CHLORIDE 0.9 % IV SOLN: 250.0000 mL | INTRAVENOUS | Status: DC

## 2011-12-17 MED ORDER — OXYTOCIN 40 UNITS IN LACTATED RINGERS INFUSION - SIMPLE MED: 1.0000 m[IU]/min | INTRAVENOUS | Status: DC

## 2011-12-17 MED ORDER — IBUPROFEN 600 MG PO TABS
600.0000 mg | ORAL_TABLET | Freq: Four times a day (QID) | ORAL | Status: DC | PRN
  Filled 2011-12-17 (×6): qty 1

## 2011-12-17 MED ORDER — DIPHENHYDRAMINE HCL 25 MG PO CAPS
25.0000 mg | ORAL_CAPSULE | ORAL | Status: DC | PRN
  Administered 2011-12-18: 25 mg via ORAL
  Filled 2011-12-17 (×2): qty 1

## 2011-12-17 MED ORDER — IBUPROFEN 600 MG PO TABS: 600.0000 mg | ORAL_TABLET | Freq: Four times a day (QID) | ORAL | Status: DC | PRN

## 2011-12-17 MED ORDER — ONDANSETRON HCL 4 MG PO TABS: 4.0000 mg | ORAL_TABLET | ORAL | Status: DC | PRN

## 2011-12-17 SURGICAL SUPPLY — 28 items
CLOTH BEACON ORANGE TIMEOUT ST (SAFETY) ×2 IMPLANT
DRESSING TELFA 8X3 (GAUZE/BANDAGES/DRESSINGS) IMPLANT
ELECT REM PT RETURN 9FT ADLT (ELECTROSURGICAL) ×2
ELECTRODE REM PT RTRN 9FT ADLT (ELECTROSURGICAL) ×1 IMPLANT
EXTRACTOR VACUUM M CUP 4 TUBE (SUCTIONS) IMPLANT
GAUZE SPONGE 4X4 12PLY STRL LF (GAUZE/BANDAGES/DRESSINGS) ×4 IMPLANT
GLOVE BIO SURGEON STRL SZ7 (GLOVE) ×4 IMPLANT
GOWN PREVENTION PLUS LG XLONG (DISPOSABLE) ×4 IMPLANT
KIT ABG SYR 3ML LUER SLIP (SYRINGE) ×2 IMPLANT
NDL HYPO 25X5/8 SAFETYGLIDE (NEEDLE) ×1 IMPLANT
NEEDLE HYPO 25X5/8 SAFETYGLIDE (NEEDLE) ×2 IMPLANT
NS IRRIG 1000ML POUR BTL (IV SOLUTION) ×2 IMPLANT
PACK C SECTION WH (CUSTOM PROCEDURE TRAY) ×2 IMPLANT
PAD ABD 7.5X8 STRL (GAUZE/BANDAGES/DRESSINGS) IMPLANT
SLEEVE SCD COMPRESS KNEE MED (MISCELLANEOUS) IMPLANT
STAPLER VISISTAT 35W (STAPLE) IMPLANT
STRIP CLOSURE SKIN 1/4X4 (GAUZE/BANDAGES/DRESSINGS) ×2 IMPLANT
SUT CHROMIC 0 CT 1 (SUTURE) IMPLANT
SUT CHROMIC 0 CTX 36 (SUTURE) ×4 IMPLANT
SUT CHROMIC 2 0 SH (SUTURE) IMPLANT
SUT PDS AB 0 CT 36 (SUTURE) ×2 IMPLANT
SUT PLAIN 0 NONE (SUTURE) IMPLANT
SUT PLAIN 2 0 XLH (SUTURE) IMPLANT
SUT VIC AB 3-0 CT1 27 (SUTURE) ×2
SUT VIC AB 3-0 CT1 TAPERPNT 27 (SUTURE) ×1 IMPLANT
TOWEL OR 17X24 6PK STRL BLUE (TOWEL DISPOSABLE) ×4 IMPLANT
TRAY FOLEY CATH 14FR (SET/KITS/TRAYS/PACK) ×2 IMPLANT
WATER STERILE IRR 1000ML POUR (IV SOLUTION) ×2 IMPLANT

## 2011-12-17 NOTE — Progress Notes (Signed)
Patient ID: Cathy [name], female   DOB: 16-Apr-1976, 36 y.o.   MRN: [id number] FHR with repetitive late decels not responding to oxygen or position changes.  Will Procede with primary cesarean section. Risk of cesarean section discussed.  These include:  Risk of infection;  Risk of hemorrhage that could require transfusions with the associated risk of aids or hepatitis;  Excessive bleeding could require hysterectomy;  Risk of injury to adjacent organs including bladder, bowel or ureters;  Risk of DVT's and possible pulmonary embolus.  Patient expresses a understanding of indications and risks.;

## 2011-12-17 NOTE — Anesthesia Preprocedure Evaluation (Signed)
Anesthesia Evaluation  Patient identified by MRN, date of birth, ID band Patient awake    Reviewed: Allergy & Precautions, H&P , Patient's Chart, lab work & pertinent test results  Airway Mallampati: III TM Distance: >3 FB Neck ROM: Full    Dental No notable dental hx. (+) Teeth Intact   Pulmonary neg pulmonary ROS,  breath sounds clear to auscultation  Pulmonary exam normal       Cardiovascular negative cardio ROS  Rhythm:Regular Rate:Normal     Neuro/Psych  Headaches, negative psych ROS   GI/Hepatic negative GI ROS, Neg liver ROS,   Endo/Other  negative endocrine ROS  Renal/GU negative Renal ROS  negative genitourinary   Musculoskeletal   Abdominal Normal abdominal exam  (+)   Peds  Hematology negative hematology ROS (+)   Anesthesia Other Findings   Reproductive/Obstetrics (+) Pregnancy                           Anesthesia Physical Anesthesia Plan  ASA: II and Emergent  Anesthesia Plan: Spinal   Post-op Pain Management:    Induction:   Airway Management Planned:   Additional Equipment:   Intra-op Plan:   Post-operative Plan:   Informed Consent: I have reviewed the patients History and Physical, chart, labs and discussed the procedure including the risks, benefits and alternatives for the proposed anesthesia with the patient or authorized representative who has indicated his/her understanding and acceptance.     Plan Discussed with: Anesthesiologist, CRNA and Surgeon  Anesthesia Plan Comments:         Anesthesia Quick Evaluation

## 2011-12-17 NOTE — Transfer of Care (Signed)
Immediate Anesthesia Transfer of Care Note  Patient: Cathy [name]  Procedure(s) Performed: Procedure(s) (LRB): CESAREAN SECTION (N/A)  Patient Location: PACU  Anesthesia Type: Spinal  Level of Consciousness: awake, alert  and oriented  Airway & Oxygen Therapy: Patient Spontanous Breathing  Post-op Assessment: Report given to PACU RN and Post -op Vital signs reviewed and stable  Post vital signs: Reviewed and stable  Complications: No apparent anesthesia complications

## 2011-12-17 NOTE — Op Note (Signed)
Patient name  Cathy [name], Cathy [name] DICTATION#  [id number] CSN# [id number]  [facility], MD 12/17/2011 11:15 AM

## 2011-12-17 NOTE — H&P (Signed)
Cathy [name] is a 36 y.o. female presenting for eval of decr FM, HA and RUQ pain,>>>eval in MAU with normal PIH labs, R NST, NO relief with PO Percocet + GI cocktail  Pt concerned re HA and decr FM and requests adm + induction, which was sched for later this week. Maternal Medical History:  Fetal activity: Perceived fetal activity is decreased.    Prenatal complications: Pre-eclampsia.     OB History    Grav Para Term Preterm Abortions TAB SAB Ect Mult Living   1 0 0 0 0 0 0 0 0 0      No past medical history on file. Past Surgical History  Procedure Date  . Cholecystectomy 2006  . Laparoscopic endometriosis fulguration 1997   Family History: family history is not on file. Social History:  does not have a smoking history on file. She does not have any smokeless tobacco history on file. She reports that she does not drink alcohol or use illicit drugs.   Prenatal Transfer Tool  Maternal Diabetes: No Genetic Screening: Normal Maternal Ultrasounds/Referrals: Normal Fetal Ultrasounds or other Referrals:  None Maternal Substance Abuse:  No Significant Maternal Medications:  None Significant Maternal Lab Results:  None Other Comments:  None  ROS  Dilation: Closed Effacement (%): 30 Station: -2 Exam by:: B Erdy RN Blood pressure 133/79, pulse 80, temperature 97.2 F (36.2 C), temperature source Oral, resp. rate 20, height 5\' 3"  (1.6 m), weight 181 lb (82.101 kg). Maternal Exam:  Abdomen: Patient reports no abdominal tenderness. Fundal height is term FH.   Estimated fetal weight is AGA.   Fetal presentation: vertex  Introitus: Normal vulva. Normal vagina.  Pelvis: adequate for delivery.   Cervix: Cervix evaluated by digital exam.     Physical Exam  Constitutional: She is oriented to person, place, and time. She appears well-developed and well-nourished.  HENT:  Head: Normocephalic.  Neck: Normal range of motion. Neck supple.  Cardiovascular: Normal rate and regular  rhythm.   Respiratory: Effort normal and breath sounds normal.  GI:       Term FH, FHR 148  Genitourinary:       cx closed/vtx  Musculoskeletal: Normal range of motion.  Neurological: She is alert and oriented to person, place, and time.    Prenatal labs: ABO, Rh: O/Positive/-- (07/31 0000) Antibody:   Rubella: Immune (07/31 0000) RPR: Nonreactive (07/31 0000)  HBsAg: Negative (07/31 0000)  HIV: Non-reactive (07/31 0000)  GBS: Positive (07/31 0000)   Assessment/Plan: Term IUP, mild PIH, for induction, induction protocol and incr CS rate secondary to induct with unfav cx discussed   Angelise Petrich M 12/17/2011, 12:08 AM

## 2011-12-17 NOTE — Anesthesia Postprocedure Evaluation (Signed)
  Anesthesia Post-op Note  Patient: Cathy [name]  Procedure(s) Performed: Procedure(s) (LRB): CESAREAN SECTION (N/A)  Patient Location: PACU  Anesthesia Type: Spinal  Level of Consciousness: awake, alert  and oriented  Airway and Oxygen Therapy: Patient Spontanous Breathing  Post-op Pain: none  Post-op Assessment: Post-op Vital signs reviewed, Patient's Cardiovascular Status Stable, Respiratory Function Stable, Patent Airway, No signs of Nausea or vomiting, Pain level controlled, No headache and No backache  Post-op Vital Signs: Reviewed and stable  Complications: No apparent anesthesia complications

## 2011-12-17 NOTE — Progress Notes (Signed)
Patient ID: Cathy [name], female   DOB: 30-Sep-1975, 36 y.o.   MRN: [id number] IUP at 39+ for induction Cervix long and closed fhr with mild variables and accels Mild ctx q 2 minutes bigin amp for gbs and pitocin

## 2011-12-17 NOTE — Anesthesia Procedure Notes (Signed)
Spinal  Patient location during procedure: OR Start time: 12/17/2011 10:41 AM Staffing Anesthesiologist: Jaedah Lords A. Performed by: anesthesiologist  Preanesthetic Checklist Completed: patient identified, site marked, surgical consent, pre-op evaluation, timeout performed, IV checked, risks and benefits discussed and monitors and equipment checked Spinal Block Patient position: sitting Prep: site prepped and draped and DuraPrep Patient monitoring: heart rate, cardiac monitor, continuous pulse ox and blood pressure Approach: midline Location: L3-4 Injection technique: single-shot Needle Needle type: Sprotte  Needle gauge: 24 G Needle length: 9 cm Needle insertion depth: 4.5 cm Assessment Sensory level: T4 Additional Notes Patient tolerated procedure well. Adequate sensory level.

## 2011-12-17 NOTE — Brief Op Note (Signed)
12/16/2011 - 12/17/2011  11:17 AM  PATIENT:  Nicola Girt  36 y.o. female  PRE-OPERATIVE DIAGNOSIS:  Fetal Distress  POST-OPERATIVE DIAGNOSIS:  Fetal Distress  PROCEDURE:  Procedure(s) (LRB): CESAREAN SECTION (N/A)  SURGEON:  Surgeon(s) and Role:    * Juluis Mire, MD - Primary  PHYSICIAN ASSISTANT:   ASSISTANTS: none   ANESTHESIA:   spinal  EBL:  Total I/O In: 1000 [I.V.:1000] Out: 750 [Urine:150; Blood:600]  BLOOD ADMINISTERED:none  DRAINS: Urinary Catheter (Foley)   LOCAL MEDICATIONS USED:  NONE  SPECIMEN:  Source of Specimen:  placenta  DISPOSITION OF SPECIMEN:  PATHOLOGY  COUNTS:  YES  TOURNIQUET:  * No tourniquets in log *  DICTATION: .Other Dictation: Dictation Number [id number]  PLAN OF CARE: Admit to inpatient   PATIENT DISPOSITION:  PACU - hemodynamically stable.   Delay start of Pharmacological VTE agent (>24hrs) due to surgical blood loss or risk of bleeding: no

## 2011-12-18 ENCOUNTER — Encounter (HOSPITAL_COMMUNITY): Payer: Self-pay | Admitting: Obstetrics and Gynecology

## 2011-12-18 LAB — CBC
HCT: 29.6 % — ABNORMAL LOW (ref 36.0–46.0)
MCHC: 33.4 g/dL (ref 30.0–36.0)
Platelets: 129 10*3/uL — ABNORMAL LOW (ref 150–400)
RDW: 13.5 % (ref 11.5–15.5)
WBC: 9.1 10*3/uL (ref 4.0–10.5)

## 2011-12-18 NOTE — Progress Notes (Signed)
Subjective: Postpartum Day 1: Cesarean Delivery Patient reports tolerating PO, + flatus and no problems voiding.    Objective: Vital signs in last 24 hours: Temp:  [97.6 F (36.4 C)-98.8 F (37.1 C)] 97.9 F (36.6 C) (08/06 0635) Pulse Rate:  [68-88] 77  (08/06 0635) Resp:  [13-20] 20  (08/06 0635) BP: (105-129)/(56-77) 111/68 mmHg (08/06 0635) SpO2:  [94 %-100 %] 100 % (08/06 7829)  Physical Exam:  General: alert and cooperative Lochia: appropriate Uterine Fundus: firm Incision: healing well DVT Evaluation: No evidence of DVT seen on physical exam.   Basename 12/18/11 0555 12/16/11 2003  HGB 9.9* 12.1  HCT 29.6* 35.8*    Assessment/Plan: Status post Cesarean section. Doing well postoperatively.  Continue current care.  Malva Diesing G 12/18/2011, 8:02 AM

## 2011-12-18 NOTE — Op Note (Signed)
NAME:  Cathy [name], Cathy [name] NO.:  [id number]  MEDICAL RECORD NO.:  [id number]  LOCATION:  9105                          FACILITY:  [facility]  PHYSICIAN:  Juluis Mire, M.D.   DATE OF BIRTH:  1975-08-27  DATE OF PROCEDURE:  12/17/2011 DATE OF DISCHARGE:                              OPERATIVE REPORT   PREOP DIAGNOSES:  Intrauterine pregnancy at 39+ weeks with induction of labor.  Subsequent fetal intolerance of labor.  POSTOPERATIVE DIAGNOSES:  Intrauterine pregnancy at 39+ weeks with induction of labor.  Subsequent fetal intolerance of labor.  PROCEDURE:  Low-transverse cesarean section.  SURGEON:  Juluis Mire, MD  ANESTHESIA:  Spinal.  ESTIMATED BLOOD LOSS:  500 mL.  PACKS:  None.  DRAINS:  Included urethral Foley.  INTRAOPERATIVE BLOOD PLACED:  None.  COMPLICATIONS:  None.  INDICATION:  The patient is a primigravida female at 44 weeks and 3 days.  She came in last evening, complained of right upper quadrant pain, headaches.  Was admitted for induction, received 1 dose of Cytotec.  The following morning, the infant started to have repetitive variable like deceleration and she was placed on oxygen, hydrating, given position changes.  Initially, the heart rate straightened out. Then, we had recurrent decelerations most of with a late component and some episodes of bradycardia.  This was unresponsive to further conservative attempts.  Cervix at that time was 1 cm relatively long. Decision was proceed with primary cesarean section.  The risks were discussed including the risk of infection.  Risk of hemorrhage that could require transfusion with the risk of AIDS or hepatitis.  Excessive bleeding could require hysterectomy.  There was a risk of injury to adjacent organs, requiring further exploratory surgery, risk of deep venous thrombosis and pulmonary embolus.  PROCEDURE:  The patient was placed taken to the OR and placed in the supine position with left  lateral tilt.  After a satisfactory level of spinal anesthesia was obtained, the abdomen was prepped out with Betadine and draped as a sterile field.  A low-transverse skin incision was made with knife and carried through the subcutaneous tissue. Anterior rectus fascia was entered sharply and incision was fashioned laterally.  Fascia was taken off the muscle superiorly and inferiorly. Rectus muscles were separated in the midline.  Anterior peritoneum was identified, entered sharply, and incision of perineum was extended both superiorly and inferiorly.  A low-transverse bladder flap was developed. A low-transverse uterine incision was begun with knife, extended laterally using manual traction.  Amniotic fluid was clear.  The infant was delivered with elevation of head and fundal pressure.  Infant was a viable female,  Apgars were 8/9, umbilical artery pH is pending.  Placenta was delivered manually.  There was no signs of any placental abnormalities.  The placenta was sent to Pathology.  There was no nuchal cord noted.  Uterus was exteriorized for closure.  Tubes and ovaries were unremarkable.  Uterus was closed in a locking suture of 0 chromic using a two-layer closure technique.  We had good hemostasis and clear urine output.  Uterus was returned to the abdominal cavity.  Perineum and muscle was closed with running suture  of 3-0 Vicryl, fascia was closed with a running suture of 0 PDS.  The skin was closed with a running subcuticular of 4-0 Monocryl and Dermabond.  Sponge, instrument, and needle counts were reported as correct by circulating nurse x2. Urine output remained clear at the time of closure.  The patient tolerated the procedure well, was returned to the recovery room in good condition.     Juluis Mire, M.D.     JSM/MEDQ  D:  12/17/2011  T:  12/18/2011  Job:  [id number]

## 2011-12-19 MED ORDER — OXYCODONE-ACETAMINOPHEN 5-325 MG PO TABS: 1.0000 | ORAL_TABLET | ORAL | Status: AC | PRN

## 2011-12-19 MED ORDER — IBUPROFEN 600 MG PO TABS: 600.0000 mg | ORAL_TABLET | Freq: Four times a day (QID) | ORAL | Status: AC | PRN

## 2011-12-19 NOTE — Discharge Summary (Signed)
Obstetric Discharge Summary Reason for Admission: induction of labor Prenatal Procedures: ultrasound Intrapartum Procedures: cesarean: low cervical, transverse Postpartum Procedures: none Complications-Operative and Postpartum: none Hemoglobin  Date Value Range Status  12/18/2011 9.9* 12.0 - 15.0 g/dL Final     REPEATED TO VERIFY     DELTA CHECK NOTED     HCT  Date Value Range Status  12/18/2011 29.6* 36.0 - 46.0 % Final    Physical Exam:  General: alert and cooperative Lochia: appropriate Uterine Fundus: firm Incision: healing well DVT Evaluation: No evidence of DVT seen on physical exam.  Discharge Diagnoses: Term Pregnancy-delivered  Discharge Information: Date: 12/19/2011 Activity: pelvic rest Diet: routine Medications: PNV, Ibuprofen and Percocet Condition: stable Instructions: refer to practice specific booklet Discharge to: home   Newborn Data: Live born female  Birth Weight: 7 lb 15.3 oz (3610 g) APGAR: 8, 9  Home with mother.  CURTIS,CAROL G 12/19/2011, 8:15 AM

## 2011-12-19 NOTE — Progress Notes (Signed)
Subjective: Postpartum Day 2: Cesarean Delivery Patient reports tolerating PO, + flatus and no problems voiding.    Objective: Vital signs in last 24 hours: Temp:  [98 F (36.7 C)-98.7 F (37.1 C)] 98.7 F (37.1 C) (08/07 0606) Pulse Rate:  [88-92] 88  (08/07 0606) Resp:  [18] 18  (08/07 0606) BP: (102-122)/(62-72) 106/71 mmHg (08/07 0606) SpO2:  [99 %] 99 % (08/06 0936)  Physical Exam:  General: alert and cooperative Lochia: appropriate Uterine Fundus: firm Incision: healing well DVT Evaluation: No evidence of DVT seen on physical exam.   Basename 12/18/11 0555 12/16/11 2003  HGB 9.9* 12.1  HCT 29.6* 35.8*    Assessment/Plan: Status post Cesarean section. Doing well postoperatively.  Discharge home with standard precautions and return to clinic in 1-2 weeks.  CURTIS,CAROL G 12/19/2011, 8:06 AM

## 2012-03-19 ENCOUNTER — Other Ambulatory Visit: Payer: Self-pay | Admitting: Orthopedic Surgery

## 2012-04-03 ENCOUNTER — Encounter (HOSPITAL_BASED_OUTPATIENT_CLINIC_OR_DEPARTMENT_OTHER): Payer: Self-pay | Admitting: *Deleted

## 2012-04-09 ENCOUNTER — Ambulatory Visit (HOSPITAL_BASED_OUTPATIENT_CLINIC_OR_DEPARTMENT_OTHER): Payer: Managed Care, Other (non HMO) | Admitting: Certified Registered Nurse Anesthetist

## 2012-04-09 ENCOUNTER — Encounter (HOSPITAL_BASED_OUTPATIENT_CLINIC_OR_DEPARTMENT_OTHER)
Admission: RE | Disposition: A | Discharge status: Home / Self Care | Payer: Self-pay | Source: Ambulatory Visit | Attending: Orthopedic Surgery

## 2012-04-09 ENCOUNTER — Encounter (HOSPITAL_BASED_OUTPATIENT_CLINIC_OR_DEPARTMENT_OTHER): Payer: Self-pay | Admitting: Orthopedic Surgery

## 2012-04-09 ENCOUNTER — Encounter (HOSPITAL_BASED_OUTPATIENT_CLINIC_OR_DEPARTMENT_OTHER): Payer: Self-pay | Admitting: Certified Registered Nurse Anesthetist

## 2012-04-09 ENCOUNTER — Ambulatory Visit (HOSPITAL_BASED_OUTPATIENT_CLINIC_OR_DEPARTMENT_OTHER)
Admission: RE | Admit: 2012-04-09 | Discharge: 2012-04-09 | Disposition: A | Discharge status: Home / Self Care | Payer: Managed Care, Other (non HMO) | Source: Ambulatory Visit | Attending: Orthopedic Surgery | Admitting: Orthopedic Surgery

## 2012-04-09 DIAGNOSIS — Z8249 Family history of ischemic heart disease and other diseases of the circulatory system: Secondary | ICD-10-CM | POA: Insufficient documentation

## 2012-04-09 DIAGNOSIS — Z9089 Acquired absence of other organs: Secondary | ICD-10-CM | POA: Insufficient documentation

## 2012-04-09 DIAGNOSIS — Z833 Family history of diabetes mellitus: Secondary | ICD-10-CM | POA: Insufficient documentation

## 2012-04-09 DIAGNOSIS — G5600 Carpal tunnel syndrome, unspecified upper limb: Secondary | ICD-10-CM | POA: Insufficient documentation

## 2012-04-09 DIAGNOSIS — Z882 Allergy status to sulfonamides status: Secondary | ICD-10-CM | POA: Insufficient documentation

## 2012-04-09 DIAGNOSIS — Z8261 Family history of arthritis: Secondary | ICD-10-CM | POA: Insufficient documentation

## 2012-04-09 DIAGNOSIS — M654 Radial styloid tenosynovitis [de Quervain]: Secondary | ICD-10-CM | POA: Insufficient documentation

## 2012-04-09 HISTORY — PX: DORSAL COMPARTMENT RELEASE: SHX5039

## 2012-04-09 HISTORY — DX: Other seasonal allergic rhinitis: J30.2

## 2012-04-09 HISTORY — PX: CARPAL TUNNEL RELEASE: SHX101

## 2012-04-09 SURGERY — RELEASE, FIRST DORSAL COMPARTMENT, HAND
Anesthesia: General | Site: Wrist | Laterality: Right | Wound class: Clean

## 2012-04-09 MED ORDER — BUPIVACAINE HCL (PF) 0.25 % IJ SOLN
INTRAMUSCULAR | Status: DC | PRN
  Administered 2012-04-09: 7.5 mL

## 2012-04-09 MED ORDER — IBUPROFEN 200 MG PO TABS: 800.0000 mg | ORAL_TABLET | Freq: Three times a day (TID) | ORAL | Status: DC

## 2012-04-09 MED ORDER — PROMETHAZINE HCL 25 MG/ML IJ SOLN: 6.2500 mg | INTRAMUSCULAR | Status: DC | PRN

## 2012-04-09 MED ORDER — PROPOFOL 10 MG/ML IV BOLUS
INTRAVENOUS | Status: DC | PRN
  Administered 2012-04-09: 150 mg via INTRAVENOUS

## 2012-04-09 MED ORDER — OXYCODONE HCL 5 MG PO TABS: 5.0000 mg | ORAL_TABLET | Freq: Once | ORAL | Status: DC | PRN

## 2012-04-09 MED ORDER — CEFAZOLIN SODIUM-DEXTROSE 2-3 GM-% IV SOLR
2.0000 g | INTRAVENOUS | Status: AC
  Administered 2012-04-09: 2 g via INTRAVENOUS

## 2012-04-09 MED ORDER — CHLORHEXIDINE GLUCONATE 4 % EX LIQD: 60.0000 mL | Freq: Once | CUTANEOUS | Status: DC

## 2012-04-09 MED ORDER — ONDANSETRON HCL 4 MG/2ML IJ SOLN
INTRAMUSCULAR | Status: DC | PRN
  Administered 2012-04-09: 4 mg via INTRAVENOUS

## 2012-04-09 MED ORDER — HYDROCODONE-ACETAMINOPHEN 5-325 MG PO TABS: 1.0000 | ORAL_TABLET | Freq: Four times a day (QID) | ORAL | Status: DC | PRN

## 2012-04-09 MED ORDER — HYDROMORPHONE HCL PF 1 MG/ML IJ SOLN: 0.2500 mg | INTRAMUSCULAR | Status: DC | PRN

## 2012-04-09 MED ORDER — DEXAMETHASONE SODIUM PHOSPHATE 4 MG/ML IJ SOLN
INTRAMUSCULAR | Status: DC | PRN
  Administered 2012-04-09: 10 mg via INTRAVENOUS

## 2012-04-09 MED ORDER — OXYCODONE HCL 5 MG/5ML PO SOLN: 5.0000 mg | Freq: Once | ORAL | Status: DC | PRN

## 2012-04-09 MED ORDER — MIDAZOLAM HCL 2 MG/2ML IJ SOLN: 1.0000 mg | INTRAMUSCULAR | Status: DC | PRN

## 2012-04-09 MED ORDER — 0.9 % SODIUM CHLORIDE (POUR BTL) OPTIME
TOPICAL | Status: DC | PRN
  Administered 2012-04-09: 1000 mL

## 2012-04-09 MED ORDER — MIDAZOLAM HCL 5 MG/5ML IJ SOLN
INTRAMUSCULAR | Status: DC | PRN
  Administered 2012-04-09: 1 mg via INTRAVENOUS

## 2012-04-09 MED ORDER — FENTANYL CITRATE 0.05 MG/ML IJ SOLN: 50.0000 ug | Freq: Once | INTRAMUSCULAR | Status: DC

## 2012-04-09 MED ORDER — LIDOCAINE HCL (CARDIAC) 20 MG/ML IV SOLN
INTRAVENOUS | Status: DC | PRN
  Administered 2012-04-09: 60 mg via INTRAVENOUS

## 2012-04-09 MED ORDER — FENTANYL CITRATE 0.05 MG/ML IJ SOLN
INTRAMUSCULAR | Status: DC | PRN
  Administered 2012-04-09: 50 ug via INTRAVENOUS
  Administered 2012-04-09: 25 ug via INTRAVENOUS

## 2012-04-09 MED ORDER — LACTATED RINGERS IV SOLN
INTRAVENOUS | Status: DC
  Administered 2012-04-09: 10:00:00 via INTRAVENOUS

## 2012-04-09 SURGICAL SUPPLY — 44 items
BANDAGE GAUZE ELAST BULKY 4 IN (GAUZE/BANDAGES/DRESSINGS) ×3 IMPLANT
BLADE SURG 15 STRL LF DISP TIS (BLADE) ×2 IMPLANT
BLADE SURG 15 STRL SS (BLADE) ×3
BNDG CMPR 9X4 STRL LF SNTH (GAUZE/BANDAGES/DRESSINGS) ×2
BNDG COHESIVE 3X5 TAN STRL LF (GAUZE/BANDAGES/DRESSINGS) ×3 IMPLANT
BNDG ESMARK 4X9 LF (GAUZE/BANDAGES/DRESSINGS) ×1 IMPLANT
CHLORAPREP W/TINT 26ML (MISCELLANEOUS) ×3 IMPLANT
CLOTH BEACON ORANGE TIMEOUT ST (SAFETY) ×3 IMPLANT
CORDS BIPOLAR (ELECTRODE) ×3 IMPLANT
COVER MAYO STAND STRL (DRAPES) ×3 IMPLANT
COVER TABLE BACK 60X90 (DRAPES) ×3 IMPLANT
CUFF TOURNIQUET SINGLE 18IN (TOURNIQUET CUFF) ×3 IMPLANT
DECANTER SPIKE VIAL GLASS SM (MISCELLANEOUS) IMPLANT
DRAPE EXTREMITY T 121X128X90 (DRAPE) ×3 IMPLANT
DRAPE SURG 17X23 STRL (DRAPES) ×3 IMPLANT
DRSG KUZMA FLUFF (GAUZE/BANDAGES/DRESSINGS) ×3 IMPLANT
GAUZE XEROFORM 1X8 LF (GAUZE/BANDAGES/DRESSINGS) ×3 IMPLANT
GLOVE BIO SURGEON STRL SZ 6.5 (GLOVE) ×3 IMPLANT
GLOVE BIOGEL PI IND STRL 7.0 (GLOVE) IMPLANT
GLOVE BIOGEL PI IND STRL 8.5 (GLOVE) ×2 IMPLANT
GLOVE BIOGEL PI INDICATOR 7.0 (GLOVE) ×1
GLOVE BIOGEL PI INDICATOR 8.5 (GLOVE) ×1
GLOVE SURG ORTHO 8.0 STRL STRW (GLOVE) ×3 IMPLANT
GOWN BRE IMP PREV XXLGXLNG (GOWN DISPOSABLE) ×3 IMPLANT
GOWN PREVENTION PLUS XLARGE (GOWN DISPOSABLE) ×3 IMPLANT
NEEDLE 27GAX1X1/2 (NEEDLE) ×1 IMPLANT
NS IRRIG 1000ML POUR BTL (IV SOLUTION) ×3 IMPLANT
PACK BASIN DAY SURGERY FS (CUSTOM PROCEDURE TRAY) ×3 IMPLANT
PAD CAST 3X4 CTTN HI CHSV (CAST SUPPLIES) ×2 IMPLANT
PADDING CAST ABS 4INX4YD NS (CAST SUPPLIES) ×1
PADDING CAST ABS COTTON 4X4 ST (CAST SUPPLIES) ×2 IMPLANT
PADDING CAST COTTON 3X4 STRL (CAST SUPPLIES) ×3
SPLINT PLASTER CAST XFAST 3X15 (CAST SUPPLIES) IMPLANT
SPLINT PLASTER XTRA FASTSET 3X (CAST SUPPLIES)
SPONGE GAUZE 4X4 12PLY (GAUZE/BANDAGES/DRESSINGS) ×3 IMPLANT
STOCKINETTE 4X48 STRL (DRAPES) ×3 IMPLANT
SUT VIC AB 4-0 P2 18 (SUTURE) IMPLANT
SUT VICRYL 4-0 PS2 18IN ABS (SUTURE) IMPLANT
SUT VICRYL RAPIDE 4/0 PS 2 (SUTURE) ×3 IMPLANT
SYR BULB 3OZ (MISCELLANEOUS) ×3 IMPLANT
SYR CONTROL 10ML LL (SYRINGE) ×1 IMPLANT
TOWEL OR 17X24 6PK STRL BLUE (TOWEL DISPOSABLE) ×6 IMPLANT
UNDERPAD 30X30 INCONTINENT (UNDERPADS AND DIAPERS) ×3 IMPLANT
WATER STERILE IRR 1000ML POUR (IV SOLUTION) ×2 IMPLANT

## 2012-04-09 NOTE — Brief Op Note (Signed)
04/09/2012  11:25 AM  PATIENT:  Cathy [name]  36 y.o. female  PRE-OPERATIVE DIAGNOSIS:  DEQUERVAIN'S RIGHT AND CARPAL TUNNEL RIGHT  POST-OPERATIVE DIAGNOSIS:  DEQUERVAIN'S RIGHT AND CARPAL TUNNEL RIGHT  PROCEDURE:  Procedure(s) (LRB) with comments: RELEASE DORSAL COMPARTMENT (DEQUERVAIN) (Right) - RELEASE 1ST DORSAL RIGHT CARPAL TUNNEL RELEASE (Right)  SURGEON:  Surgeon(s) and Role:    * Nicki Reaper, MD - Primary  PHYSICIAN ASSISTANT:   ASSISTANTS: none   ANESTHESIA:   local and general  EBL:  Total I/O In: 600 [I.V.:600] Out: -   BLOOD ADMINISTERED:none  DRAINS: none   LOCAL MEDICATIONS USED:  MARCAINE     SPECIMEN:  No Specimen  DISPOSITION OF SPECIMEN:  N/A  COUNTS:  YES  TOURNIQUET:   Total Tourniquet Time Documented: Upper Arm (Right) - 20 minutes  DICTATION: .Other Dictation: Dictation Number [id number]  PLAN OF CARE: Discharge to home after PACU  PATIENT DISPOSITION:  PACU - hemodynamically stable.
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
## 2012-04-09 NOTE — Transfer of Care (Signed)
Immediate Anesthesia Transfer of Care Note  Patient: Cathy [name]  Procedure(s) Performed: Procedure(s) (LRB) with comments: RELEASE DORSAL COMPARTMENT (DEQUERVAIN) (Right) - RELEASE 1ST DORSAL RIGHT CARPAL TUNNEL RELEASE (Right)  Patient Location: PACU  Anesthesia Type:General  Level of Consciousness: awake and patient cooperative  Airway & Oxygen Therapy: Patient Spontanous Breathing and Patient connected to face mask oxygen  Post-op Assessment: Report given to PACU RN and Post -op Vital signs reviewed and stable  Post vital signs: Reviewed and stable  Complications: No apparent anesthesia complications

## 2012-04-09 NOTE — Anesthesia Postprocedure Evaluation (Signed)
  Anesthesia Post-op Note  Patient: Cathy [name]  Procedure(s) Performed: Procedure(s) (LRB) with comments: RELEASE DORSAL COMPARTMENT (DEQUERVAIN) (Right) - RELEASE 1ST DORSAL RIGHT CARPAL TUNNEL RELEASE (Right)  Patient Location: PACU  Anesthesia Type:General  Level of Consciousness: awake  Airway and Oxygen Therapy: Patient Spontanous Breathing  Post-op Pain: mild  Post-op Assessment: Post-op Vital signs reviewed, Patient's Cardiovascular Status Stable, Respiratory Function Stable, Patent Airway, No signs of Nausea or vomiting and Pain level controlled  Post-op Vital Signs: stable  Complications: No apparent anesthesia complications

## 2012-04-09 NOTE — Anesthesia Procedure Notes (Signed)
Procedure Name: LMA Insertion Date/Time: 04/09/2012 10:50 AM Performed by: Cathy [name] Pre-anesthesia Checklist: Patient identified, Emergency Drugs available, Suction available and Patient being monitored Patient Re-evaluated:Patient Re-evaluated prior to inductionOxygen Delivery Method: Circle System Utilized Preoxygenation: Pre-oxygenation with 100% oxygen Intubation Type: IV induction Ventilation: Mask ventilation without difficulty LMA: LMA inserted LMA Size: 4.0 Number of attempts: 1 Airway Equipment and Method: bite block Placement Confirmation: positive ETCO2 Tube secured with: Tape Dental Injury: Teeth and Oropharynx as per pre-operative assessment

## 2012-04-09 NOTE — Anesthesia Preprocedure Evaluation (Addendum)
Anesthesia Evaluation  Patient identified by MRN, date of birth, ID band Patient awake    Reviewed: Allergy & Precautions, H&P , NPO status , Patient's Chart, lab work & pertinent test results  Airway Mallampati: I TM Distance: >3 FB Neck ROM: Full    Dental   Pulmonary  breath sounds clear to auscultation        Cardiovascular Rhythm:Regular Rate:Normal     Neuro/Psych  Neuromuscular disease    GI/Hepatic   Endo/Other    Renal/GU      Musculoskeletal   Abdominal   Peds  Hematology   Anesthesia Other Findings   Reproductive/Obstetrics                           Anesthesia Physical Anesthesia Plan  ASA: I  Anesthesia Plan: General   Post-op Pain Management:    Induction: Intravenous  Airway Management Planned:   Additional Equipment:   Intra-op Plan:   Post-operative Plan:   Informed Consent: I have reviewed the patients History and Physical, chart, labs and discussed the procedure including the risks, benefits and alternatives for the proposed anesthesia with the patient or authorized representative who has indicated his/her understanding and acceptance.     Plan Discussed with: CRNA and Surgeon  Anesthesia Plan Comments: (Change to GA LMA as unable to find vein for Bier block)       Anesthesia Quick Evaluation

## 2012-04-09 NOTE — Op Note (Signed)
Dictation Number [id number]

## 2012-04-14 ENCOUNTER — Encounter (HOSPITAL_BASED_OUTPATIENT_CLINIC_OR_DEPARTMENT_OTHER): Payer: Self-pay | Admitting: Orthopedic Surgery

## 2012-04-14 NOTE — Op Note (Signed)
NAME:  Cathy [name], Cathy [name] NO.:  [id number]  MEDICAL RECORD NO.:  [id number]  LOCATION:                                 FACILITY:  PHYSICIAN:  Cindee Salt, M.D.       DATE OF BIRTH:  08/31/1975  DATE OF PROCEDURE:  04/09/2012 DATE OF DISCHARGE:                              OPERATIVE REPORT   PREOPERATIVE DIAGNOSIS:  Carpal tunnel syndrome, right hand.  De Quervain's, right wrist.  POSTOPERATIVE DIAGNOSIS:  Carpal tunnel syndrome, right hand.  De Quervain's, right wrist.  OPERATION:  Decompression of median nerve.  Release of first dorsal compartment, right wrist, extensor tendons.  SURGEON:  Cindee Salt, M.D.  ASSISTANT:  None.  ANESTHESIA:  General with local infiltration.  ANESTHESIOLOGIST:  Bedelia Person, M.D.  HISTORY:  The patient is a 36 year old female with a history of carpal tunnel syndrome.  EMG nerve conduction is positive, which has not responded to conservative treatment.  She also has de Quervain's tendonitis, which has also not responded to conservative treatment, has elected to undergo surgical release of each.  Pre, peri and postoperative course have been discussed along with risks and complications.  She is aware that there is no guarantee with the surgery; possibility of infection; recurrence of injury to arteries, nerves, tendons; incomplete relief of symptoms; dystrophy.  In the preoperative area, the patient is seen, the extremity marked by both the patient and surgeon, and antibiotic given.  PROCEDURE:  The patient was brought to the operating room where general anesthetic was carried out without difficulty.  She was prepped using ChloraPrep, supine position with the right arm free.  A 3-minute dry time was allowed.  Time-out taken, confirming patient and procedure. The limb was exsanguinated with an Esmarch bandage.  Tourniquet placed high on the arm was inflated to 250 mmHg.  An incision was made overlying of the first dorsal  compartment, extensor tendon, carried down through the subcutaneous tissue.  Radial nerve was identified and protected.  An incision was then made on the most dorsal aspect of the first dorsal compartment retinaculum.  The extensor pollicis brevis was immediately encountered.  A septum was present in the abductor pollicis longus, this was released and excised.  The synovectomy was performed proximally.  The wound was copiously irrigated with saline.  The skin was then closed with interrupted 4-0 Vicryl Rapide sutures.  A separate incision was then made longitudinally in the palm, carried down through the subcutaneous tissue.  Bleeders were again electrocauterized with bipolar.  The palmar fascia was split revealing the flexor tendon of the ring and little finger identified to the ulnar side of median nerve. The right carpal retinaculum was incised with sharp dissection.  Right angle and Sewall retractor were placed between skin and forearm fascia. The fascia was released for approximately a 1.5 cm proximal to the wrist crease under direct vision.  Area of compression to the nerve was apparent.  No further lesions were identified.  The wound was irrigated. The skin was closed with interrupted 4-0 Vicryl Rapide sutures.  Local infiltration with 0.25% Marcaine without epinephrine was given to each of the incision areas, approximately 8 mL  was used.  A sterile compressive dressing and splint to the thumb applied.  On deflation of the tourniquet, all fingers were immediately pinked.  She was taken to the recovery room for observation in satisfactory condition.  She will be discharged to home to return to the [facility] [place] in 1 week, on Motrin and Vicodin.          ______________________________ Cindee Salt, M.D.     GK/MEDQ  D:  04/09/2012  T:  04/10/2012  Job:  [id number]

## 2013-05-05 ENCOUNTER — Emergency Department (HOSPITAL_COMMUNITY): Payer: Managed Care, Other (non HMO)

## 2013-05-05 ENCOUNTER — Encounter (HOSPITAL_COMMUNITY): Payer: Self-pay | Admitting: Emergency Medicine

## 2013-05-05 ENCOUNTER — Inpatient Hospital Stay (HOSPITAL_COMMUNITY)
Admission: EM | Admit: 2013-05-05 | Discharge: 2013-05-07 | DRG: 439 | Disposition: A | Discharge status: Home / Self Care | Payer: Managed Care, Other (non HMO) | Attending: Internal Medicine | Admitting: Internal Medicine

## 2013-05-05 DIAGNOSIS — A09 Infectious gastroenteritis and colitis, unspecified: Secondary | ICD-10-CM | POA: Diagnosis present

## 2013-05-05 DIAGNOSIS — R748 Abnormal levels of other serum enzymes: Secondary | ICD-10-CM | POA: Diagnosis present

## 2013-05-05 DIAGNOSIS — Z9089 Acquired absence of other organs: Secondary | ICD-10-CM

## 2013-05-05 DIAGNOSIS — R52 Pain, unspecified: Secondary | ICD-10-CM | POA: Diagnosis present

## 2013-05-05 DIAGNOSIS — Z7982 Long term (current) use of aspirin: Secondary | ICD-10-CM

## 2013-05-05 DIAGNOSIS — E876 Hypokalemia: Secondary | ICD-10-CM | POA: Diagnosis present

## 2013-05-05 DIAGNOSIS — K859 Acute pancreatitis without necrosis or infection, unspecified: Principal | ICD-10-CM | POA: Diagnosis present

## 2013-05-05 DIAGNOSIS — R197 Diarrhea, unspecified: Secondary | ICD-10-CM | POA: Diagnosis present

## 2013-05-05 DIAGNOSIS — R1013 Epigastric pain: Secondary | ICD-10-CM | POA: Diagnosis present

## 2013-05-05 DIAGNOSIS — R110 Nausea: Secondary | ICD-10-CM | POA: Diagnosis present

## 2013-05-05 LAB — COMPREHENSIVE METABOLIC PANEL
ALT: 39 U/L — ABNORMAL HIGH (ref 0–35)
AST: 56 U/L — ABNORMAL HIGH (ref 0–37)
Alkaline Phosphatase: 120 U/L — ABNORMAL HIGH (ref 39–117)
CO2: 21 mEq/L (ref 19–32)
Calcium: 8.1 mg/dL — ABNORMAL LOW (ref 8.4–10.5)
GFR calc non Af Amer: >90 mL/min (ref >90)
Potassium: 3.9 mEq/L (ref 3.5–5.1)
Sodium: 135 mEq/L (ref 135–145)
Total Protein: 7.2 g/dL (ref 6.0–8.3)

## 2013-05-05 LAB — CBC WITH DIFFERENTIAL/PLATELET
Basophils Absolute: 0 10*3/uL (ref 0.0–0.1)
Eosinophils Relative: 1 % (ref 0–5)
Lymphocytes Relative: 24 % (ref 12–46)
Lymphs Abs: 1.5 10*3/uL (ref 0.7–4.0)
MCV: 96.2 fL (ref 78.0–100.0)
Neutrophils Relative %: 68 % (ref 43–77)
Platelets: 171 10*3/uL (ref 150–400)
RBC: 3.95 MIL/uL (ref 3.87–5.11)
RDW: 13 % (ref 11.5–15.5)
WBC: 6.1 10*3/uL (ref 4.0–10.5)

## 2013-05-05 LAB — URINALYSIS, ROUTINE W REFLEX MICROSCOPIC
Bilirubin Urine: NEGATIVE
Hgb urine dipstick: NEGATIVE
Ketones, ur: NEGATIVE mg/dL
Specific Gravity, Urine: 1.012 (ref 1.005–1.030)
pH: 6 (ref 5.0–8.0)

## 2013-05-05 LAB — LIPASE, BLOOD: Lipase: 2013 U/L — ABNORMAL HIGH (ref 11–59)

## 2013-05-05 MED ORDER — IOHEXOL 300 MG/ML  SOLN
100.0000 mL | Freq: Once | INTRAMUSCULAR | Status: AC | PRN
  Administered 2013-05-05: 100 mL via INTRAVENOUS

## 2013-05-05 MED ORDER — NORETHINDRONE ACET-ETHINYL EST 1.5-30 MG-MCG PO TABS
1.0000 | ORAL_TABLET | Freq: Every day | ORAL | Status: DC
  Filled 2013-05-05: qty 1

## 2013-05-05 MED ORDER — HEPARIN SODIUM (PORCINE) 5000 UNIT/ML IJ SOLN
5000.0000 [IU] | Freq: Three times a day (TID) | INTRAMUSCULAR | Status: DC
  Administered 2013-05-05 – 2013-05-07 (×4): 5000 [IU] via SUBCUTANEOUS
  Filled 2013-05-05 (×8): qty 1

## 2013-05-05 MED ORDER — MORPHINE SULFATE 4 MG/ML IJ SOLN
4.0000 mg | Freq: Once | INTRAMUSCULAR | Status: AC
Start: 2013-05-05 — End: 2013-05-05
  Administered 2013-05-05: 4 mg via INTRAVENOUS
  Filled 2013-05-05: qty 1

## 2013-05-05 MED ORDER — SODIUM CHLORIDE 0.9 % IV SOLN
INTRAVENOUS | Status: DC
  Administered 2013-05-06 – 2013-05-07 (×3): via INTRAVENOUS

## 2013-05-05 MED ORDER — IOHEXOL 300 MG/ML  SOLN
50.0000 mL | Freq: Once | INTRAMUSCULAR | Status: AC | PRN
  Administered 2013-05-05: 50 mL via ORAL

## 2013-05-05 MED ORDER — HYDROMORPHONE HCL PF 1 MG/ML IJ SOLN
1.0000 mg | INTRAMUSCULAR | Status: DC | PRN
  Administered 2013-05-05 – 2013-05-06 (×3): 1 mg via INTRAVENOUS
  Filled 2013-05-05 (×3): qty 1

## 2013-05-05 MED ORDER — HYDROMORPHONE HCL PF 1 MG/ML IJ SOLN
1.0000 mg | Freq: Once | INTRAMUSCULAR | Status: AC
  Administered 2013-05-05: 1 mg via INTRAVENOUS
  Filled 2013-05-05: qty 1

## 2013-05-05 MED ORDER — ONDANSETRON HCL 4 MG/2ML IJ SOLN
4.0000 mg | Freq: Four times a day (QID) | INTRAMUSCULAR | Status: DC | PRN
  Administered 2013-05-05: 4 mg via INTRAVENOUS
  Filled 2013-05-05: qty 2

## 2013-05-05 MED ORDER — ONDANSETRON HCL 4 MG/2ML IJ SOLN
4.0000 mg | Freq: Once | INTRAMUSCULAR | Status: AC
  Administered 2013-05-05: 4 mg via INTRAVENOUS
  Filled 2013-05-05: qty 2

## 2013-05-05 NOTE — ED Notes (Signed)
Bed: WA02 Expected date:  Expected time:  Means of arrival:  Comments: 37 y/o F, abd pain, from nursing home

## 2013-05-05 NOTE — H&P (Signed)
[facility] History and Physical  Kenna Seward ZOX:[id number] DOB: 1975/08/09 DOA: 05/05/2013  Referring physician: EDP PCP: Meriel Pica, MD   Chief Complaint: Epigastric pain   HPI: Cathy [name] is a 37 y.o. female who presents to the ED with severe epigastric pain that onset at 4 AM this morning.  Patient has a history of cholecystectomy in the past, and she did have a quarter pounder to eat yesterday at [facility] which is very unusual for her.  She has very minimal EtOH intake (less than 2 beers a month).  No fevers nor chills, was having sweats earlier today.  Nausea, and diarrhea but no vomiting.  Review of Systems: Systems reviewed.  As above, otherwise negative  Past Medical History  Diagnosis Date  . Complication of anesthesia     states severe itching after gallbladder surgery  . Seasonal allergies   . 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 De Quervain's tenosynovitis, right 03/2012   Past Surgical History  Procedure Laterality Date  . Laparoscopic endometriosis fulguration  1996  . Cesarean section  12/17/2011    Procedure: CESAREAN SECTION;  Surgeon: Juluis Mire, MD;  Location: [facility];  Service: Gynecology;  Laterality: N/A;  . Cholecystectomy  11/06/2004  . Dorsal compartment release  04/09/2012    Procedure: RELEASE DORSAL COMPARTMENT (DEQUERVAIN);  Surgeon: Nicki Reaper, MD;  Location: [facility];  Service: Orthopedics;  Laterality: Right;  RELEASE 1ST DORSAL RIGHT  . Carpal tunnel release  04/09/2012    Procedure: CARPAL TUNNEL RELEASE;  Surgeon: Nicki Reaper, MD;  Location: [facility];  Service: Orthopedics;  Laterality: Right;   Social History:  reports that she has never smoked. She has never used smokeless tobacco. She reports that she drinks alcohol. She reports that she does not use illicit drugs.  Allergies  Allergen Reactions  . Adhesive [Tape] Rash  . Other Other (See Comments)    PERFUMED  SOAPS/LOTIONS - IS BOTHERED BY FRAGRANCES  . Sulfa Antibiotics Nausea And Vomiting    History reviewed. No pertinent family history.   Prior to Admission medications   Medication Sig Start Date End Date Taking? Authorizing Provider  Aspirin-Acetaminophen-Caffeine 260-130-16 MG TABS Take 1 Package by mouth 2 (two) times daily as needed (pain).   Yes Historical Provider, MD  Norethindrone Acetate-Ethinyl Estradiol (JUNEL 1.5/30) 1.5-30 MG-MCG tablet Take 1 tablet by mouth daily.   Yes Historical Provider, MD   Physical Exam: Filed Vitals:   05/05/13 1933  BP: 127/63  Pulse: 71  Temp: 98.6 F (37 C)  Resp: 18    BP 127/63  Pulse 71  Temp(Src) 98.6 F (37 C) (Oral)  Resp 18  Ht 5\' 3"  (1.6 m)  Wt 72.576 kg (160 lb)  BMI 28.35 kg/m2  SpO2 98%  LMP 03/24/2013  General Appearance:    Alert, oriented, no distress, appears stated age  Head:    Normocephalic, atraumatic  Eyes:    PERRL, EOMI, sclera non-icteric        Nose:   Nares without drainage or epistaxis. Mucosa, turbinates normal  Throat:   Moist mucous membranes. Oropharynx without erythema or exudate.  Neck:   Supple. No carotid bruits.  No thyromegaly.  No lymphadenopathy.   Back:     No CVA tenderness, no spinal tenderness  Lungs:     Clear to auscultation bilaterally, without wheezes, rhonchi or rales  Chest wall:    No tenderness to palpitation  Heart:  Regular rate and rhythm without murmurs, gallops, rubs  Abdomen:     Soft, epigastric tenderness, nondistended, normal bowel sounds, no organomegaly  Genitalia:    deferred  Rectal:    deferred  Extremities:   No clubbing, cyanosis or edema.  Pulses:   2+ and symmetric all extremities  Skin:   Skin color, texture, turgor normal, no rashes or lesions  Lymph nodes:   Cervical, supraclavicular, and axillary nodes normal  Neurologic:   CNII-XII intact. Normal strength, sensation and reflexes      throughout    Labs on Admission:  Basic Metabolic Panel:  Recent  Labs Lab 05/05/13 1700  NA 135  K 3.9  CL 103  CO2 21  GLUCOSE 108*  BUN 11  CREATININE 0.76  CALCIUM 8.1*   Liver Function Tests:  Recent Labs Lab 05/05/13 1700  AST 56*  ALT 39*  ALKPHOS 120*  BILITOT 0.9  PROT 7.2  ALBUMIN 3.4*    Recent Labs Lab 05/05/13 1700  LIPASE 2013*   No results found for this basename: AMMONIA,  in the last 168 hours CBC:  Recent Labs Lab 05/05/13 1700  WBC 6.1  NEUTROABS 4.1  HGB 12.7  HCT 38.0  MCV 96.2  PLT 171   Cardiac Enzymes: No results found for this basename: CKTOTAL, CKMB, CKMBINDEX, TROPONINI,  in the last 168 hours  BNP (last 3 results) No results found for this basename: PROBNP,  in the last 8760 hours CBG: No results found for this basename: GLUCAP,  in the last 168 hours  Radiological Exams on Admission: Ct Abdomen Pelvis W Contrast  05/05/2013   CLINICAL DATA:  Cholecystectomy.  Abdominal pain.  Nausea.  EXAM: CT ABDOMEN AND PELVIS WITH CONTRAST  TECHNIQUE: Multidetector CT imaging of the abdomen and pelvis was performed using the standard protocol following bolus administration of intravenous contrast.  CONTRAST:  50mL OMNIPAQUE IOHEXOL 300 MG/ML SOLN, OMNIPAQUE IOHEXOL 300 MG/ML SOLN  COMPARISON:  None.  FINDINGS: Lung Bases: Negative.  Liver: Tiny low-density lesion in the knee right hepatic lobe likely represents a cyst. Mild intrahepatic biliary ductal dilated patch that normal appearance of the liver status post cholecystectomy with mild intrahepatic biliary ductal dilation.  Spleen:  Normal.  Gallbladder:  Cholecystectomy  Common bile duct:  Normal.  Pancreas:  Normal.  Adrenal glands:  Normal.  Kidneys: Normal enhancement. No calculi. Both ureters appear within normal limits.  Stomach:  Distended with oral contrast.  Small bowel:  Normal.  No obstruction.  No mesenteric adenopathy.  Colon: Fluid levels in the proximal colon are abnormal but nonspecific. There is no mural thickening. The appendix is not  identified. No right lower quadrant inflammatory changes. Distal colon appears normal.  Pelvic Genitourinary: Normal appearance of the uterus and adnexa. No free fluid. Urinary bladder is collapsed.  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 No aggressive osseous lesions.  Vasculature: Double barrel IVC incidentally noted.  Body Wall:  Tiny fat containing periumbilical hernia.  IMPRESSION: 1. Air-fluid levels in the proximal colon are abnormal but nonspecific, most commonly associated with enteric infection. 2. Cholecystectomy.   Electronically Signed   By: Andreas Newport M.D.   On: 05/05/2013 19:36    EKG: Independently reviewed.  Assessment/Plan Principal Problem:   Abdominal pain, acute, epigastric Active Problems:   Elevated lipase   Elevated alkaline phosphatase level   Epigastric pain   1. Acute epigastric abdominal pain and elevated lipase - highly  suspicious for acute pancreatitis, Lipid panel ordered for AM, could have been a retained stone that passed but bile duct normal on CT scan at this time, CT did not show definite evidence of inflammation of pancreas.  Very high lipase but no Ranson criteria on admission.  Alternatively this could represent an infectious enterocolitis.  Plan NPO, IVF for hydration, repeat labs in AM, pain control.  AST/ALT is very mildly elevated in the 50s. 2. Elevated alk phos level - apparently chronic per patient and seen on previous lab work as well, actually this appears to be less elevated than it was in the past.    Code Status: Full Code  Family Communication: Family at bedside Disposition Plan: Admit to obs   Time spent: 50 min  GARDNER, JARED M. [facility] Pager [phone]  If 7AM-7PM, please contact the day team taking care of the patient [email or web address] Password [facility] 05/05/2013, 8:52 PM

## 2013-05-05 NOTE — ED Notes (Signed)
Pt c/o of sudden onset of stomach pain seen at [facility] urgent care. IV started and nubain 10mg  IVP given. EMS called for transport to [facility] for evaluation.

## 2013-05-05 NOTE — ED Provider Notes (Signed)
CSN: [id number]     Arrival date & time 05/05/13  1629 History   First MD Initiated Contact with Patient 05/05/13 1649     Chief Complaint  Patient presents with  . Abdominal Pain  . Nausea   (Consider location/radiation/quality/duration/timing/severity/associated sxs/prior Treatment) HPI Comments: Patient is a 37 year old female who presents today for abdominal pain, nausea, diarrhea. This began earlier this morning around 4 AM. She reports is the kind of pain where "you don't know whether eating as good or bad". She ate a pop tart which worsened her pain. The pain feels like a pressure in her epigastric area and is worse with palpation. Her diarrhea is watery and nonbloody. She reports that she did eat [employer] yesterday which is abnormal for her. She denies any recent travel, recent antibiotic use, sick contacts. She has history of prior cholecystectomy and cesarean section.  The history is provided by the patient. No language interpreter was used.    Past Medical History  Diagnosis Date  . Complication of anesthesia     states severe itching after gallbladder surgery  . Seasonal allergies   . 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 De Quervain's tenosynovitis, right 03/2012   Past Surgical History  Procedure Laterality Date  . Laparoscopic endometriosis fulguration  1996  . Cesarean section  12/17/2011    Procedure: CESAREAN SECTION;  Surgeon: Juluis Mire, MD;  Location: [facility];  Service: Gynecology;  Laterality: N/A;  . Cholecystectomy  11/06/2004  . Dorsal compartment release  04/09/2012    Procedure: RELEASE DORSAL COMPARTMENT (DEQUERVAIN);  Surgeon: Nicki Reaper, MD;  Location: [facility];  Service: Orthopedics;  Laterality: Right;  RELEASE 1ST DORSAL RIGHT  . Carpal tunnel release  04/09/2012    Procedure: CARPAL TUNNEL RELEASE;  Surgeon: Nicki Reaper, MD;  Location: [facility];  Service: Orthopedics;  Laterality: Right;   No family  history on file. History  Substance Use Topics  . Smoking status: Never Smoker   . Smokeless tobacco: Never Used  . Alcohol Use: Yes     Comment: seldom   OB History   Grav Para Term Preterm Abortions TAB SAB Ect Mult Living   1 1 1  0 0 0 0 0 0 1     Review of Systems  Constitutional: Positive for diaphoresis. Negative for fever and chills.  Respiratory: Negative for shortness of breath.   Cardiovascular: Negative for chest pain.  Gastrointestinal: Positive for nausea and diarrhea. Negative for vomiting and abdominal pain.  All other systems reviewed and are negative.    Allergies  Adhesive; Other; and Sulfa antibiotics  Home Medications   Current Outpatient Rx  Name  Route  Sig  Dispense  Refill  . HYDROcodone-acetaminophen (NORCO) 5-325 MG per tablet   Oral   Take 1 tablet by mouth every 6 (six) hours as needed for pain.   30 tablet   0   . ibuprofen (MOTRIN IB) 200 MG tablet   Oral   Take 4 tablets (800 mg total) by mouth 3 (three) times daily with meals.   30 tablet   0    BP 124/63  Pulse 69  Temp(Src) 97.8 F (36.6 C) (Oral)  Resp 20  Ht 5\' 3"  (1.6 m)  Wt 160 lb (72.576 kg)  BMI 28.35 kg/m2  SpO2 100% Physical Exam  Nursing note and vitals reviewed. Constitutional: She is oriented to person, place, and time. She appears well-developed and well-nourished. No distress.  HENT:  Head: Normocephalic and atraumatic.  Right Ear: External ear normal.  Left Ear: External ear normal.  Nose: Nose normal.  Mouth/Throat: Oropharynx is clear and moist.  Eyes: Conjunctivae are normal.  Neck: Normal range of motion.  Cardiovascular: Normal rate, regular rhythm and normal heart sounds.   Pulmonary/Chest: Effort normal and breath sounds normal. No stridor. No respiratory distress. She has no wheezes. She has no rales.  Abdominal: Soft. She exhibits no distension. There is tenderness in the epigastric area. There is no rigidity and no guarding.  Musculoskeletal:  Normal range of motion.  Neurological: She is alert and oriented to person, place, and time. She has normal strength.  Skin: Skin is warm and dry. She is not diaphoretic. No erythema.  Psychiatric: She has a normal mood and affect. Her behavior is normal.    ED Course  Procedures (including critical care time) Labs Review Labs Reviewed  COMPREHENSIVE METABOLIC PANEL - Abnormal; Notable for the following:    Glucose, Bld 108 (*)    Calcium 8.1 (*)    Albumin 3.4 (*)    AST 56 (*)    ALT 39 (*)    Alkaline Phosphatase 120 (*)    All other components within normal limits  URINALYSIS, ROUTINE W REFLEX MICROSCOPIC - Abnormal; Notable for the following:    APPearance CLOUDY (*)    All other components within normal limits  LIPASE, BLOOD - Abnormal; Notable for the following:    Lipase 2013 (*)    All other components within normal limits  CBC WITH DIFFERENTIAL  PREGNANCY, URINE  CBC  COMPREHENSIVE METABOLIC PANEL  LIPID PANEL   Imaging Review Ct Abdomen Pelvis W Contrast  05/05/2013   CLINICAL DATA:  Cholecystectomy.  Abdominal pain.  Nausea.  EXAM: CT ABDOMEN AND PELVIS WITH CONTRAST  TECHNIQUE: Multidetector CT imaging of the abdomen and pelvis was performed using the standard protocol following bolus administration of intravenous contrast.  CONTRAST:  50mL OMNIPAQUE IOHEXOL 300 MG/ML SOLN, OMNIPAQUE IOHEXOL 300 MG/ML SOLN  COMPARISON:  None.  FINDINGS: Lung Bases: Negative.  Liver: Tiny low-density lesion in the knee right hepatic lobe likely represents a cyst. Mild intrahepatic biliary ductal dilated patch that normal appearance of the liver status post cholecystectomy with mild intrahepatic biliary ductal dilation.  Spleen:  Normal.  Gallbladder:  Cholecystectomy  Common bile duct:  Normal.  Pancreas:  Normal.  Adrenal glands:  Normal.  Kidneys: Normal enhancement. No calculi. Both ureters appear within normal limits.  Stomach:  Distended with oral contrast.  Small bowel:   Normal.  No obstruction.  No mesenteric adenopathy.  Colon: Fluid levels in the proximal colon are abnormal but nonspecific. There is no mural thickening. The appendix is not identified. No right lower quadrant inflammatory changes. Distal colon appears normal.  Pelvic Genitourinary: Normal appearance of the uterus and adnexa. No free fluid. Urinary bladder is collapsed.  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 No aggressive osseous lesions.  Vasculature: Double barrel IVC incidentally noted.  Body Wall:  Tiny fat containing periumbilical hernia.  IMPRESSION: 1. Air-fluid levels in the proximal colon are abnormal but nonspecific, most commonly associated with enteric infection. 2. Cholecystectomy.   Electronically Signed   By: Andreas Newport M.D.   On: 05/05/2013 19:36    EKG Interpretation    Date/Time:    Ventricular Rate:    PR Interval:    QRS Duration:   QT Interval:  QTC Calculation:   R Axis:     Text Interpretation:              MDM   1. Abdominal pain, acute, epigastric   2. Elevated alkaline phosphatase level   3. Elevated lipase    Patient presents with abdominal pain. Lipase is 2013. CT scan shows possible enteric infection. Pancreas appears normal. Pain controlled in ED. Admitted to medicine. Admission appreciated. Discussed case with Dr. Micheline Maze who agrees with plan. Patient / Family / Caregiver informed of clinical course, understand medical decision-making process, and agree with plan.     Mora Bellman, PA-C 05/06/13 [phone]

## 2013-05-05 NOTE — ED Notes (Signed)
Pending for hospitalist consult and bed placement at the moment. Pt assisted to restroom and returned

## 2013-05-06 LAB — CBC
HCT: 33.6 % — ABNORMAL LOW (ref 36.0–46.0)
Hemoglobin: 11.5 g/dL — ABNORMAL LOW (ref 12.0–15.0)
MCHC: 34.2 g/dL (ref 30.0–36.0)
Platelets: 135 10*3/uL — ABNORMAL LOW (ref 150–400)
WBC: 4.3 10*3/uL (ref 4.0–10.5)

## 2013-05-06 LAB — COMPREHENSIVE METABOLIC PANEL
AST: 111 U/L — ABNORMAL HIGH (ref 0–37)
Alkaline Phosphatase: 150 U/L — ABNORMAL HIGH (ref 39–117)
BUN: 6 mg/dL (ref 6–23)
GFR calc Af Amer: >90 mL/min (ref >90)
GFR calc non Af Amer: >90 mL/min (ref >90)
Glucose, Bld: 98 mg/dL (ref 70–99)
Potassium: 3.2 mEq/L — ABNORMAL LOW (ref 3.5–5.1)
Sodium: 136 mEq/L (ref 135–145)
Total Bilirubin: 1.2 mg/dL (ref 0.3–1.2)
Total Protein: 5.6 g/dL — ABNORMAL LOW (ref 6.0–8.3)

## 2013-05-06 LAB — LIPID PANEL: Cholesterol: 148 mg/dL (ref 0–200)

## 2013-05-06 MED ORDER — POTASSIUM CHLORIDE CRYS ER 20 MEQ PO TBCR
40.0000 meq | EXTENDED_RELEASE_TABLET | Freq: Once | ORAL | Status: AC
  Administered 2013-05-06: 40 meq via ORAL
  Filled 2013-05-06: qty 2

## 2013-05-06 NOTE — ED Provider Notes (Signed)
Medical screening examination/treatment/procedure(s) were performed by non-physician practitioner and as supervising physician I was immediately available for consultation/collaboration.  Megan E Docherty, MD 05/06/13 1055 

## 2013-05-06 NOTE — Progress Notes (Signed)
Patient ID: Cathy [name], female   DOB: 1975-05-31, 37 y.o.   MRN: [id number] [facility] PROGRESS NOTE  Kassidie Hendriks WUJ:[id number] DOB: 11/20/1975 DOA: 05/05/2013 PCP: Meriel Pica, MD  Brief narrative: 37 y.o. female who presented to the ED with severe epigastric pain that started several hours prior to this admission, sharp and constant, 10/10 in severity and radiating to the left side of the back area. pt denies any specific alleviating factors but explains that it is worse with eating. Patient denies similar events in the past. No fevers, chills, no other systemic concerns.   Principal Problem:   Abdominal pain, acute, epigastric - secondary to acute pancreatitis pf unclear etiology - pt denies alcohol use, normal triglycerides on lipid panel - lipase is trending down 2000 --> 600's - will advance diet to soft and will continue analgesia as needed - continue IVF  Active Problems:   Elevated alkaline phosphatase level - in pt with history of cholecystectomy - CMET In AM   ? Infectious enteritis  - noted on CT - discussed with Dr. Elnoria Howard, no ABX recommended - continue conservative management.    Hypokalemia - will supplement and repeat BMP in AM  Consultants:  None  Procedures/Studies:  Ct Abdomen Pelvis W Contrast  05/05/2013   Air-fluid levels in proximal colon abnormal but nonspecific, most commonly a/w enteric infection. Cholecystectomy.    Antibiotics:  None  Code Status: Full Family Communication: Pt and husband at bedside Disposition Plan: Home when medically stable  HPI/Subjective: No events overnight.   Objective: Filed Vitals:   05/05/13 1636 05/05/13 1933 05/05/13 2151 05/06/13 0547  BP:  127/63 123/58 106/52  Pulse:  71 67 77  Temp:  98.6 F (37 C) 98.2 F (36.8 C) 98 F (36.7 C)  TempSrc:  Oral Oral Oral  Resp:  18 18 16   Height: 5\' 3"  (1.6 m)     Weight: 72.576 kg (160 lb)     SpO2:  98% 100% 100%    Intake/Output Summary  (Last 24 hours) at 05/06/13 1053 Last data filed at 05/06/13 1000  Gross per 24 hour  Intake 1139.58 ml  Output   1300 ml  Net -160.42 ml    Exam:   General:  Pt is alert, follows commands appropriately, not in acute distress  Cardiovascular: Regular rate and rhythm, S1/S2, no murmurs, no rubs, no gallops  Respiratory: Clear to auscultation bilaterally, no wheezing, no crackles, no rhonchi  Abdomen: Soft, non tender, non distended, bowel sounds present, no guarding  Extremities: No edema, pulses DP and PT palpable bilaterally  Neuro: Grossly nonfocal  Data Reviewed: Basic Metabolic Panel:  Recent Labs Lab 05/05/13 1700 05/06/13 0740  NA 135 136  K 3.9 3.2*  CL 103 106  CO2 21 22  GLUCOSE 108* 98  BUN 11 6  CREATININE 0.76 0.80  CALCIUM 8.1* 7.4*   Liver Function Tests:  Recent Labs Lab 05/05/13 1700 05/06/13 0740  AST 56* 111*  ALT 39* 107*  ALKPHOS 120* 150*  BILITOT 0.9 1.2  PROT 7.2 5.6*  ALBUMIN 3.4* 2.7*    Recent Labs Lab 05/05/13 1700  LIPASE 2013*   CBC:  Recent Labs Lab 05/05/13 1700 05/06/13 0740  WBC 6.1 4.3  NEUTROABS 4.1  --   HGB 12.7 11.5*  HCT 38.0 33.6*  MCV 96.2 95.7  PLT 171 135*     Scheduled Meds . heparin  5,000 Units Subcutaneous Q8H  . Norethindrone Acetate-Ethinyl Estradiol  1 tablet Oral  Daily   Continuous Infusions: . sodium chloride 125 mL/hr at 05/06/13 8413     Debbora Presto, MD  [facility] Pager [phone]  If 7PM-7AM, please contact night-coverage [email or web address] Password [facility] 05/06/2013, 10:53 AM   LOS: 1 day

## 2013-05-06 NOTE — Progress Notes (Signed)
UR completed. Patient changed to inpatient r/t requiring IVF @ 125cc/hr, NPO, and con't monitoring of labs.

## 2013-05-07 LAB — COMPREHENSIVE METABOLIC PANEL
ALT: 91 U/L — ABNORMAL HIGH (ref 0–35)
Albumin: 2.9 g/dL — ABNORMAL LOW (ref 3.5–5.2)
Alkaline Phosphatase: 161 U/L — ABNORMAL HIGH (ref 39–117)
BUN: 4 mg/dL — ABNORMAL LOW (ref 6–23)
CO2: 23 mEq/L (ref 19–32)
Chloride: 107 mEq/L (ref 96–112)
GFR calc Af Amer: >90 mL/min (ref >90)
GFR calc non Af Amer: 88 mL/min — ABNORMAL LOW (ref >90)
Glucose, Bld: 100 mg/dL — ABNORMAL HIGH (ref 70–99)
Potassium: 3.7 mEq/L (ref 3.5–5.1)
Total Bilirubin: 0.5 mg/dL (ref 0.3–1.2)

## 2013-05-07 LAB — CBC
HCT: 34.7 % — ABNORMAL LOW (ref 36.0–46.0)
Hemoglobin: 11.4 g/dL — ABNORMAL LOW (ref 12.0–15.0)
MCH: 31.4 pg (ref 26.0–34.0)
RBC: 3.63 MIL/uL — ABNORMAL LOW (ref 3.87–5.11)
WBC: 3.3 10*3/uL — ABNORMAL LOW (ref 4.0–10.5)

## 2013-05-07 LAB — LIPASE, BLOOD: Lipase: 59 U/L (ref 11–59)

## 2013-05-07 MED ORDER — ONDANSETRON HCL 4 MG PO TABS: 4.0000 mg | ORAL_TABLET | Freq: Three times a day (TID) | ORAL | Status: DC | PRN

## 2013-05-07 MED ORDER — HYDROMORPHONE HCL 4 MG PO TABS: 4.0000 mg | ORAL_TABLET | ORAL | Status: DC | PRN

## 2013-05-07 NOTE — Progress Notes (Signed)
Patient voiced readiness to go home. D/C instructions given in presence of boyfriend and mother. Ambulatory without c/o any discomfort. Staff member to transfer to vehicle via wheelchair.

## 2013-05-07 NOTE — Discharge Summary (Signed)
Physician Discharge Summary  Cathy [name] WUJ:[id number] DOB: Dec 14, 1975 DOA: 05/05/2013  PCP: Meriel Pica, MD  Admit date: 05/05/2013 Discharge date: 05/07/2013  Recommendations for Outpatient Follow-up:  1. Pt will need to follow up with PCP in 2-3 weeks post discharge 2. Please obtain BMP to evaluate electrolytes and kidney function 3. Please also check CBC to evaluate Hg and Hct levels 4. Pt advised to follow up with Dr. Loreta Ave  Discharge Diagnoses: Acute pancreatitis  Principal Problem:   Abdominal pain, acute, epigastric Active Problems:   Elevated lipase   Elevated alkaline phosphatase level   Epigastric pain  Discharge Condition: Stable  Diet recommendation: Heart healthy diet discussed in details   Brief narrative:  37 y.o. female who presented to the ED with severe epigastric pain that started several hours prior to this admission, sharp and constant, 10/10 in severity and radiating to the left side of the back area. pt denies any specific alleviating factors but explains that it is worse with eating. Patient denies similar events in the past. No fevers, chills, no other systemic concerns.   Principal Problem:  Abdominal pain, acute, epigastric  - secondary to acute pancreatitis of unclear etiology  - pt denies alcohol use, normal triglycerides on lipid panel  - lipase is trending down 2000 --> 600's and WNL this AM - pt tolerating regular diet  Active Problems:  Elevated alkaline phosphatase level  - in pt with history of cholecystectomy  - CMET indicating LFT's trending down  ? Infectious enteritis  - noted on CT  - discussed with Dr. Elnoria Howard, no ABX recommended  - continue conservative management as pt is responding well  Hypokalemia  - supplemented and WNL this AM  Consultants:  None Procedures/Studies:  Ct Abdomen Pelvis W Contrast 05/05/2013 Air-fluid levels in proximal colon abnormal but nonspecific, most commonly a/w enteric infection.  Cholecystectomy.  Antibiotics:  None  Code Status: Full  Family Communication: Pt and husband at bedside   Discharge Exam: Filed Vitals:   05/07/13 0500  BP: 111/74  Pulse: 76  Temp: 98 F (36.7 C)  Resp: 18   Filed Vitals:   05/06/13 0547 05/06/13 1457 05/06/13 2130 05/07/13 0500  BP: 106/52 100/65 113/73 111/74  Pulse: 77 65 78 76  Temp: 98 F (36.7 C) 98.2 F (36.8 C) 98 F (36.7 C) 98 F (36.7 C)  TempSrc: Oral Oral Oral Oral  Resp: 16 16 18 18   Height:      Weight:      SpO2: 100% 99% 100% 98%    General: Pt is alert, follows commands appropriately, not in acute distress Cardiovascular: Regular rate and rhythm, S1/S2 +, no murmurs, no rubs, no gallops Respiratory: Clear to auscultation bilaterally, no wheezing, no crackles, no rhonchi Abdominal: Soft, non tender, non distended, bowel sounds +, no guarding Extremities: no edema, no cyanosis, pulses palpable bilaterally DP and PT Neuro: Grossly nonfocal  Discharge Instructions  Discharge Orders   Future Orders Complete By Expires   Diet - low sodium heart healthy  As directed    Increase activity slowly  As directed        Medication List         Aspirin-Acetaminophen-Caffeine 260-130-16 MG Tabs  Take 1 Package by mouth 2 (two) times daily as needed (pain).     HYDROmorphone 4 MG tablet  Commonly known as:  DILAUDID  Take 1 tablet (4 mg total) by mouth every 4 (four) hours as needed for severe pain.  JUNEL 1.5/30 1.5-30 MG-MCG tablet  Generic drug:  Norethindrone Acetate-Ethinyl Estradiol  Take 1 tablet by mouth daily.     ondansetron 4 MG tablet  Commonly known as:  ZOFRAN  Take 1 tablet (4 mg total) by mouth every 8 (eight) hours as needed for nausea or vomiting.           Follow-up Information   Follow up with Meriel Pica, MD In 2 weeks.   Specialty:  Obstetrics and Gynecology   Contact information:   [address] [place] [address] [phone]        Follow up with Debbora Presto, MD. (call my cell [phone])    Specialty:  Internal Medicine   Contact information:   [address] [place] [address] [phone]        The results of significant diagnostics from this hospitalization (including imaging, microbiology, ancillary and laboratory) are listed below for reference.     Microbiology: No results found for this or any previous visit (from the past 240 hour(s)).   Labs: Basic Metabolic Panel:  Recent Labs Lab 05/05/13 1700 05/06/13 0740 05/07/13 0512  NA 135 136 137  K 3.9 3.2* 3.7  CL 103 106 107  CO2 21 22 23   GLUCOSE 108* 98 100*  BUN 11 6 4*  CREATININE 0.76 0.80 0.84  CALCIUM 8.1* 7.4* 8.1*   Liver Function Tests:  Recent Labs Lab 05/05/13 1700 05/06/13 0740 05/07/13 0512  AST 56* 111* 49*  ALT 39* 107* 91*  ALKPHOS 120* 150* 161*  BILITOT 0.9 1.2 0.5  PROT 7.2 5.6* 5.9*  ALBUMIN 3.4* 2.7* 2.9*    Recent Labs Lab 05/05/13 1700 05/06/13 0740 05/07/13 0512  LIPASE 2013* 633* 59   CBC:  Recent Labs Lab 05/05/13 1700 05/06/13 0740 05/07/13 0512  WBC 6.1 4.3 3.3*  NEUTROABS 4.1  --   --   HGB 12.7 11.5* 11.4*  HCT 38.0 33.6* 34.7*  MCV 96.2 95.7 95.6  PLT 171 135* 141*   SIGNED: Time coordinating discharge: Over 30 minutes  Debbora Presto, MD  [facility] 05/07/2013, 10:25 AM Pager [phone]  If 7PM-7AM, please contact night-coverage [email or web address] Password [email or web address]

## 2013-05-19 ENCOUNTER — Other Ambulatory Visit: Payer: Self-pay | Admitting: Gastroenterology

## 2013-05-19 DIAGNOSIS — K859 Acute pancreatitis without necrosis or infection, unspecified: Secondary | ICD-10-CM

## 2013-05-19 DIAGNOSIS — R110 Nausea: Secondary | ICD-10-CM

## 2013-05-21 ENCOUNTER — Inpatient Hospital Stay
Admission: RE | Admit: 2013-05-21 | Discharge: 2013-05-21 | Disposition: A | Discharge status: Home / Self Care | Payer: 59 | Source: Ambulatory Visit | Attending: Gastroenterology | Admitting: Gastroenterology

## 2013-07-26 ENCOUNTER — Inpatient Hospital Stay (HOSPITAL_COMMUNITY)
Admission: EM | Admit: 2013-07-26 | Discharge: 2013-07-27 | DRG: 439 | Disposition: A | Discharge status: Home / Self Care | Payer: Managed Care, Other (non HMO) | Attending: Internal Medicine | Admitting: Internal Medicine

## 2013-07-26 ENCOUNTER — Encounter (HOSPITAL_COMMUNITY): Payer: Self-pay | Admitting: Emergency Medicine

## 2013-07-26 DIAGNOSIS — R740 Nonspecific elevation of levels of transaminase and lactic acid dehydrogenase [LDH]: Secondary | ICD-10-CM

## 2013-07-26 DIAGNOSIS — Z9089 Acquired absence of other organs: Secondary | ICD-10-CM

## 2013-07-26 DIAGNOSIS — E876 Hypokalemia: Secondary | ICD-10-CM

## 2013-07-26 DIAGNOSIS — E871 Hypo-osmolality and hyponatremia: Secondary | ICD-10-CM

## 2013-07-26 DIAGNOSIS — R748 Abnormal levels of other serum enzymes: Secondary | ICD-10-CM

## 2013-07-26 DIAGNOSIS — R7402 Elevation of levels of lactic acid dehydrogenase (LDH): Secondary | ICD-10-CM | POA: Diagnosis present

## 2013-07-26 DIAGNOSIS — Z800 Family history of malignant neoplasm of digestive organs: Secondary | ICD-10-CM

## 2013-07-26 DIAGNOSIS — R7401 Elevation of levels of liver transaminase levels: Secondary | ICD-10-CM | POA: Diagnosis present

## 2013-07-26 DIAGNOSIS — K861 Other chronic pancreatitis: Secondary | ICD-10-CM | POA: Diagnosis present

## 2013-07-26 DIAGNOSIS — R1013 Epigastric pain: Secondary | ICD-10-CM | POA: Diagnosis present

## 2013-07-26 DIAGNOSIS — Z8249 Family history of ischemic heart disease and other diseases of the circulatory system: Secondary | ICD-10-CM

## 2013-07-26 DIAGNOSIS — K859 Acute pancreatitis without necrosis or infection, unspecified: Principal | ICD-10-CM | POA: Diagnosis present

## 2013-07-26 HISTORY — DX: Acute pancreatitis without necrosis or infection, unspecified: K85.90

## 2013-07-26 LAB — COMPREHENSIVE METABOLIC PANEL
ALT: 27 U/L (ref 0–35)
AST: 44 U/L — ABNORMAL HIGH (ref 0–37)
Albumin: 3.4 g/dL — ABNORMAL LOW (ref 3.5–5.2)
Alkaline Phosphatase: 84 U/L (ref 39–117)
BILIRUBIN TOTAL: 0.6 mg/dL (ref 0.3–1.2)
BUN: 9 mg/dL (ref 6–23)
CHLORIDE: 99 meq/L (ref 96–112)
CO2: 24 mEq/L (ref 19–32)
Calcium: 8.3 mg/dL — ABNORMAL LOW (ref 8.4–10.5)
Creatinine, Ser: 0.73 mg/dL (ref 0.50–1.10)
GFR calc non Af Amer: >90 mL/min (ref >90)
GLUCOSE: 99 mg/dL (ref 70–99)
Potassium: 3.4 mEq/L — ABNORMAL LOW (ref 3.7–5.3)
SODIUM: 135 meq/L — AB (ref 137–147)
Total Protein: 6.7 g/dL (ref 6.0–8.3)

## 2013-07-26 LAB — PREGNANCY, URINE: Preg Test, Ur: NEGATIVE

## 2013-07-26 LAB — CBC WITH DIFFERENTIAL/PLATELET
Basophils Absolute: 0 10*3/uL (ref 0.0–0.1)
Basophils Relative: 0 % (ref 0–1)
Eosinophils Absolute: 0.1 10*3/uL (ref 0.0–0.7)
Eosinophils Relative: 1 % (ref 0–5)
HCT: 39.9 % (ref 36.0–46.0)
HEMOGLOBIN: 13.9 g/dL (ref 12.0–15.0)
LYMPHS PCT: 37 % (ref 12–46)
Lymphs Abs: 1.6 10*3/uL (ref 0.7–4.0)
MCH: 33.2 pg (ref 26.0–34.0)
MCHC: 34.8 g/dL (ref 30.0–36.0)
MCV: 95.2 fL (ref 78.0–100.0)
MONOS PCT: 7 % (ref 3–12)
Monocytes Absolute: 0.3 10*3/uL (ref 0.1–1.0)
NEUTROS ABS: 2.3 10*3/uL (ref 1.7–7.7)
NEUTROS PCT: 55 % (ref 43–77)
PLATELETS: 154 10*3/uL (ref 150–400)
RBC: 4.19 MIL/uL (ref 3.87–5.11)
RDW: 12.3 % (ref 11.5–15.5)
WBC: 4.3 10*3/uL (ref 4.0–10.5)

## 2013-07-26 LAB — URINALYSIS, ROUTINE W REFLEX MICROSCOPIC
BILIRUBIN URINE: NEGATIVE
Glucose, UA: NEGATIVE mg/dL
Hgb urine dipstick: NEGATIVE
KETONES UR: NEGATIVE mg/dL
Leukocytes, UA: NEGATIVE
NITRITE: NEGATIVE
PH: 7 (ref 5.0–8.0)
Protein, ur: NEGATIVE mg/dL
SPECIFIC GRAVITY, URINE: 1.008 (ref 1.005–1.030)
Urobilinogen, UA: 1 mg/dL (ref 0.0–1.0)

## 2013-07-26 LAB — LIPASE, BLOOD: LIPASE: 1171 U/L — AB (ref 11–59)

## 2013-07-26 MED ORDER — ACETAMINOPHEN 650 MG RE SUPP: 650.0000 mg | Freq: Four times a day (QID) | RECTAL | Status: DC | PRN

## 2013-07-26 MED ORDER — HYDROMORPHONE HCL PF 1 MG/ML IJ SOLN: 1.0000 mg | INTRAMUSCULAR | Status: DC | PRN

## 2013-07-26 MED ORDER — ONDANSETRON HCL 4 MG/2ML IJ SOLN
4.0000 mg | Freq: Once | INTRAMUSCULAR | Status: AC
  Administered 2013-07-26: 4 mg via INTRAVENOUS
  Filled 2013-07-26: qty 2

## 2013-07-26 MED ORDER — HYDROMORPHONE HCL 4 MG PO TABS
4.0000 mg | ORAL_TABLET | ORAL | Status: DC | PRN
  Administered 2013-07-26: 4 mg via ORAL
  Filled 2013-07-26: qty 1

## 2013-07-26 MED ORDER — HYDROMORPHONE HCL PF 1 MG/ML IJ SOLN
1.0000 mg | Freq: Once | INTRAMUSCULAR | Status: AC
  Administered 2013-07-26: 1 mg via INTRAVENOUS
  Filled 2013-07-26: qty 1

## 2013-07-26 MED ORDER — ACETAMINOPHEN 325 MG PO TABS
650.0000 mg | ORAL_TABLET | Freq: Four times a day (QID) | ORAL | Status: DC | PRN
  Administered 2013-07-26: 650 mg via ORAL
  Filled 2013-07-26: qty 2

## 2013-07-26 MED ORDER — HYDROMORPHONE HCL PF 1 MG/ML IJ SOLN
0.5000 mg | INTRAMUSCULAR | Status: AC | PRN
  Administered 2013-07-26: 0.5 mg via INTRAVENOUS
  Filled 2013-07-26: qty 1

## 2013-07-26 MED ORDER — NORETHINDRONE ACET-ETHINYL EST 1.5-30 MG-MCG PO TABS: 1.0000 | ORAL_TABLET | Freq: Every day | ORAL | Status: DC

## 2013-07-26 MED ORDER — ONDANSETRON HCL 4 MG/2ML IJ SOLN: 4.0000 mg | Freq: Four times a day (QID) | INTRAMUSCULAR | Status: DC | PRN

## 2013-07-26 MED ORDER — ENOXAPARIN SODIUM 40 MG/0.4ML ~~LOC~~ SOLN
40.0000 mg | SUBCUTANEOUS | Status: DC
  Administered 2013-07-26: 40 mg via SUBCUTANEOUS
  Filled 2013-07-26 (×2): qty 0.4

## 2013-07-26 MED ORDER — SODIUM CHLORIDE 0.9 % IV SOLN: INTRAVENOUS | Status: DC

## 2013-07-26 MED ORDER — ONDANSETRON HCL 4 MG PO TABS: 4.0000 mg | ORAL_TABLET | Freq: Four times a day (QID) | ORAL | Status: DC | PRN

## 2013-07-26 MED ORDER — SODIUM CHLORIDE 0.9 % IV BOLUS (SEPSIS)
1000.0000 mL | Freq: Once | INTRAVENOUS | Status: AC
  Administered 2013-07-26: 1000 mL via INTRAVENOUS

## 2013-07-26 MED ORDER — ONDANSETRON HCL 4 MG/2ML IJ SOLN: 4.0000 mg | Freq: Three times a day (TID) | INTRAMUSCULAR | Status: DC | PRN

## 2013-07-26 MED ORDER — KCL IN DEXTROSE-NACL 20-5-0.45 MEQ/L-%-% IV SOLN
INTRAVENOUS | Status: DC
  Administered 2013-07-26: 22:00:00 via INTRAVENOUS
  Administered 2013-07-26: 1000 mL via INTRAVENOUS
  Filled 2013-07-26 (×4): qty 1000

## 2013-07-26 NOTE — ED Notes (Signed)
Patient c/o mid abdominal pain that radiates through to the back. Patient states she was diagnosed with pancreatitis in December 2014. Patient states she took hydromorphone 4 mg 1 tab at 0545. Patient rates pain 3/10. Patient states she is nauseated, but it was due to increased pain that she was having.

## 2013-07-26 NOTE — Plan of Care (Signed)
Problem: Problem: Pain Management Progression Goal: PAIN DECREASED WITH MOVEMENT OR ACTIVITY Outcome: Progressing With pain medicine

## 2013-07-26 NOTE — H&P (Addendum)
[facility] History and Physical  Cathy [name] ZOX:[id number] DOB: 05-02-1976 DOA: 07/26/2013  Referring physician:  [name] PCP:  [name], MD   Chief Complaint:  Abdominal pain  HPI:  The patient is a 38 y.o. year-old female with history of seasonal allergies and one episode of idiopathic pancreatitis in 04/2013, followed by [name] who presents with abdominal pain.  The patient was last at their baseline health two days ago.  She states that she has been feeling well since her last episode of pancreatitis, and she has not been drinking alcohol, eaten any fatty foods in the last few days.  She states she has not started any new medications recently. Yesterday, her appetite was diminished and she had some mild nausea. She ate some mashed potatoes and some macaroni, however, overnight around 3 AM, she awoke with severe epigastric pain with radiation to her back. She took 1 Dilaudid 4 mg tab, and approximately 15 minutes after taking the medication, her abdominal pain became severely worse, 10 out of 10. She states the pain is considerably worse than giving birth. She became diaphoretic and 6 her stomach. Her husband brought her to the emergency department. She has not been able to drink much.   In the emergency department, her abdominal pain started to resolve. Her vital signs were stable. Her labs were notable for sodium 135, potassium 3.4, calcium 8.3, AST 44, ALT 27, bili and AP wnl, lipase 1171 and albumin 3.4. Her CBC was within normal limits.  UA and pregnancy test neg.  She did not having any imaging studies. During her last admission, Doctor Collene Mares and suggested possibly doing an MRI. She is status post cholecystectomy.  Review of Systems:  General:  Denies fevers, chills, weight loss or gain HEENT:  Denies changes to hearing and vision, rhinorrhea, sinus congestion, sore throat CV:  Denies chest pain and palpitations, lower extremity edema.  PULM:  Denies SOB, wheezing,  cough.   GI:  Denies  constipation, diarrhea.   GU:  Denies dysuria, frequency, urgency ENDO:  Denies polyuria, polydipsia.   HEME:  Denies hematemesis, blood in stools, melena, abnormal bruising or bleeding.  LYMPH:  Denies lymphadenopathy.   MSK:  Denies arthralgias, myalgias.   DERM:  Denies skin rash or ulcer.   NEURO:  Denies focal numbness, weakness, slurred speech, confusion, facial droop.  PSYCH:  Denies anxiety and depression.    Past Medical History  Diagnosis Date  . Complication of anesthesia     states severe itching after gallbladder surgery  . Seasonal allergies   . 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 De Quervain's tenosynovitis, right 03/2012  . Pancreatitis    Past Surgical History  Procedure Laterality Date  . Laparoscopic endometriosis fulguration  1996  . Cesarean section  12/17/2011    Procedure: CESAREAN SECTION;  Surgeon: [name], MD;  Location: [facility];  Service: Gynecology;  Laterality: N/A;  . Cholecystectomy  11/06/2004  . Dorsal compartment release  04/09/2012    Procedure: RELEASE DORSAL COMPARTMENT (DEQUERVAIN);  Surgeon: [name], MD;  Location: [facility];  Service: Orthopedics;  Laterality: Right;  RELEASE 1ST DORSAL RIGHT  . Carpal tunnel release  04/09/2012    Procedure: CARPAL TUNNEL RELEASE;  Surgeon: [name], MD;  Location: [facility];  Service: Orthopedics;  Laterality: Right;   Social History:  reports that she has never smoked. She has never used smokeless tobacco. She reports that she  drinks alcohol. She reports that she does not use illicit drugs. Lives with fiance and son.  Works as Optometrist.    Allergies  Allergen Reactions  . Sudafed [Pseudoephedrine] Other (See Comments)    Makes pt delirious   . Adhesive [Tape] Rash  . Other Other (See Comments)    PERFUMED SOAPS/LOTIONS - IS BOTHERED BY FRAGRANCES Gives pt headaches  . Sulfa Antibiotics Nausea And Vomiting    Family  History  Problem Relation Age of Onset  . Colon cancer Mother 28  . Heart failure Father   . Hypertension Father   . Pancreatitis Neg Hx   . Pancreatic cancer Maternal Grandmother   . Colon cancer Paternal Grandmother   . Prostate cancer Maternal Grandfather      Prior to Admission medications   Medication Sig Start Date End Date Taking? Authorizing Provider  HYDROmorphone (DILAUDID) 4 MG tablet Take 1 tablet (4 mg total) by mouth every 4 (four) hours as needed for severe pain. 05/07/13  Yes Theodis Blaze, MD  Norethindrone Acetate-Ethinyl Estradiol (JUNEL 1.5/30) 1.5-30 MG-MCG tablet Take 1 tablet by mouth daily.   Yes Historical Provider, MD   Physical Exam: Filed Vitals:   07/26/13 0738 07/26/13 1000 07/26/13 1026 07/26/13 1100  BP: 116/48 117/59  114/56  Pulse: 71 68 72 66  Temp: 97.8 F (36.6 C)     TempSrc: Oral     Resp: 14 13 16 11   Height: 5\' 3"  (1.6 m)     Weight: 68.04 kg (150 lb)     SpO2: 100%  97%      General:  CF, NAD  Eyes:  PERRL, anicteric, non-injected.  ENT:  Nares clear.  OP clear, non-erythematous without plaques or exudates.  MMM.  Neck:  Supple without TM or JVD.    Lymph:  No cervical, supraclavicular, or submandibular LAD.  Cardiovascular:  RRR, normal S1, S2, without m/r/g.  2+ pulses, warm extremities  Respiratory:  CTA bilaterally without increased WOB.  Abdomen:  Hyperactive BS.  Soft, ND, mildly TTP in epigastrium without rebound or guarding.    Skin:  No rashes or focal lesions.  Musculoskeletal:  Normal bulk and tone.  No LE edema.  Psychiatric:  A & O x 4.  Appropriate affect.  Neurologic:  CN 3-12 intact.  5/5 strength.  Sensation intact.  Labs on Admission:  Basic Metabolic Panel:  Recent Labs Lab 07/26/13 0803  NA 135*  K 3.4*  CL 99  CO2 24  GLUCOSE 99  BUN 9  CREATININE 0.73  CALCIUM 8.3*   Liver Function Tests:  Recent Labs Lab 07/26/13 0803  AST 44*  ALT 27  ALKPHOS 84  BILITOT 0.6  PROT 6.7   ALBUMIN 3.4*    Recent Labs Lab 07/26/13 0803  LIPASE 1171*   No results found for this basename: AMMONIA,  in the last 168 hours CBC:  Recent Labs Lab 07/26/13 0803  WBC 4.3  NEUTROABS 2.3  HGB 13.9  HCT 39.9  MCV 95.2  PLT 154   Cardiac Enzymes: No results found for this basename: CKTOTAL, CKMB, CKMBINDEX, TROPONINI,  in the last 168 hours  BNP (last 3 results) No results found for this basename: PROBNP,  in the last 8760 hours CBG: No results found for this basename: GLUCAP,  in the last 168 hours  Radiological Exams on Admission: No results found.  EKG: pending  Assessment/Plan Active Problems:   Pancreatitis  ---  Pancreatitis with lipase of 1171, decreased PO  intake.  No EtOH use, new medications. S/p cholecystectomy and CT pancreas during last admission did not demonstrate mass, ductal stenosis, or other explanation.  TG 137.   -  CLD and advance as tolerated -  IVF and antiemetics -  Oral dilaudid if tolerating PO, IV if vomiting -  Dr. Olevia Perches recommended against further testing at this time, but patient would like to discuss with [name] in particular.  Will try again tomorrow.   -  Repeat lipase in AM  Hypokalemia due to poor oral intake -  Potassium in IVF  Hyponatremia likely due to poor oral intake -  IVF  Diet:  CLD Access:  PIV IVF:  yes Proph:  lovenox  Code Status: full Family Communication: patient and her friend Disposition Plan: Admit to med-surg  Time spent: 60 min Janece Canterbury [facility] Pager [phone]  If 7PM-7AM, please contact night-coverage [email or web address] Password [facility] 07/26/2013, 11:24 AM

## 2013-07-26 NOTE — Plan of Care (Signed)
Problem: Problem: Pain Management Progression Goal: PAIN MANAGEMENT ALTERNATIVE EFFECTIVE Outcome: Progressing Heat applied to abdpmen

## 2013-07-26 NOTE — ED Provider Notes (Signed)
Patient reports she was admitted to the hospital in December with pancreatitis with a lipase over 2000. She has been followed by [name], GI who has been monitoring her lipase levels and they have remained normal.  Yesterday she started having nausea and then started getting a heaviness in her abdomen that got progressively worse at 5 AM this morning when she had severe pain and nausea. She reports she is status post cholecystectomy about 10 years ago. The etiology for her pancreatitis was not determined. She denies any change in her diet. Patient took a hydromorphone pill at home and by the time she got to the ED her pain was starting to improve. However she reports her pain is starting to return.   Patient is pale, she indicates her pain is in her epigastric area.   Patient presents with her second episode of pancreatitis of uncertain etiology. Patient will be admitted for IV fluids and pancreatic rest. [name] can be consulted for  further evaluation.  Medical screening examination/treatment/procedure(s) were conducted as a shared visit with non-physician practitioner(s) and myself.  I personally evaluated the patient during the encounter.   EKG Interpretation None      [name], MD, [facility]   Janice Norrie, MD 07/26/13 1026

## 2013-07-26 NOTE — ED Provider Notes (Signed)
See prior note   Janice Norrie, MD 07/26/13 1144

## 2013-07-26 NOTE — ED Provider Notes (Signed)
CSN: [id number]     Arrival date & time 07/26/13  3244 History   First MD Initiated Contact with Patient 07/26/13 [phone]     Chief Complaint  Patient presents with  . Abdominal Pain  . Nausea     (Consider location/radiation/quality/duration/timing/severity/associated sxs/prior Treatment) The history is provided by the patient.   Pt with hx pancreatitis, diagnosed 04/2013 p/w nausea that began yesterday and LUQ pain that began overnight.  Pain is sharp, constant, located in LUQ and radiates through to the back.  Feels like prior pancreatitis.  Associated diaphoresis.  10/10 pain until dilaudid PO at home, improved to 3/10.  Did not eat much yesterday due to decreased appetite and nausea.  Denies fevers, CP, SOB, cough, urinary, bowel, or vaginal symptoms.  Pt is on birth control that she takes continuously, unsure of last menstrual period.   Abdominal surgery hx:  cholecystectomy, c-section.   During hospitalization and follow up with Dr Collene Mares, no cause was found to explain pancreatitis.     Past Medical History  Diagnosis Date  . Complication of anesthesia     states severe itching after gallbladder surgery  . Seasonal allergies   . 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 De Quervain's tenosynovitis, right 03/2012  . Pancreatitis    Past Surgical History  Procedure Laterality Date  . Laparoscopic endometriosis fulguration  1996  . Cesarean section  12/17/2011    Procedure: CESAREAN SECTION;  Surgeon: [name], MD;  Location: [facility];  Service: Gynecology;  Laterality: N/A;  . Cholecystectomy  11/06/2004  . Dorsal compartment release  04/09/2012    Procedure: RELEASE DORSAL COMPARTMENT (DEQUERVAIN);  Surgeon: [name], MD;  Location: [facility];  Service: Orthopedics;  Laterality: Right;  RELEASE 1ST DORSAL RIGHT  . Carpal tunnel release  04/09/2012    Procedure: CARPAL TUNNEL RELEASE;  Surgeon: [name], MD;  Location: [facility];   Service: Orthopedics;  Laterality: Right;   Family History  Problem Relation Age of Onset  . Cancer Mother   . Heart failure Father   . Hypertension Father    History  Substance Use Topics  . Smoking status: Never Smoker   . Smokeless tobacco: Never Used  . Alcohol Use: Yes     Comment: seldom   OB History   Grav Para Term Preterm Abortions TAB SAB Ect Mult Living   1 1 1  0 0 0 0 0 0 1     Review of Systems  Constitutional: Positive for diaphoresis. Negative for fever.  Respiratory: Negative for cough and shortness of breath.   Cardiovascular: Negative for chest pain.  Gastrointestinal: Positive for nausea and abdominal pain. Negative for vomiting, diarrhea, constipation and blood in stool.  Genitourinary: Negative for dysuria, urgency, frequency, vaginal bleeding, vaginal discharge and menstrual problem.  All other systems reviewed and are negative.      Allergies  Adhesive; Other; and Sulfa antibiotics  Home Medications   Current Outpatient Rx  Name  Route  Sig  Dispense  Refill  . Aspirin-Acetaminophen-Caffeine 260-130-16 MG TABS   Oral   Take 1 Package by mouth 2 (two) times daily as needed (pain).         Marland Kitchen HYDROmorphone (DILAUDID) 4 MG tablet   Oral   Take 1 tablet (4 mg total) by mouth every 4 (four) hours as needed for severe pain.   30 tablet   0   . Norethindrone Acetate-Ethinyl Estradiol (JUNEL 1.5/30)  1.5-30 MG-MCG tablet   Oral   Take 1 tablet by mouth daily.         . ondansetron (ZOFRAN) 4 MG tablet   Oral   Take 1 tablet (4 mg total) by mouth every 8 (eight) hours as needed for nausea or vomiting.   30 tablet   0    BP 116/48  Pulse 71  Temp(Src) 97.8 F (36.6 C) (Oral)  Resp 14  Ht 5\' 3"  (1.6 m)  Wt 150 lb (68.04 kg)  BMI 26.58 kg/m2  SpO2 100% Physical Exam  Nursing note and vitals reviewed. Constitutional: She appears well-developed and well-nourished. No distress.  HENT:  Head: Normocephalic and atraumatic.  Eyes:  Conjunctivae are normal.  Neck: Normal range of motion. Neck supple.  Cardiovascular: Normal rate and regular rhythm.   Pulmonary/Chest: Effort normal and breath sounds normal. No respiratory distress. She has no wheezes. She has no rales.  Abdominal: Soft. Bowel sounds are normal. She exhibits no distension. There is tenderness in the left upper quadrant. There is no rebound and no guarding.  Musculoskeletal: She exhibits no edema.  Neurological: She is alert. She exhibits normal muscle tone.  Skin: She is not diaphoretic.  Psychiatric: She has a normal mood and affect. Her behavior is normal.    ED Course  Procedures (including critical care time) Labs Review Labs Reviewed  COMPREHENSIVE METABOLIC PANEL - Abnormal; Notable for the following:    Sodium 135 (*)    Potassium 3.4 (*)    Calcium 8.3 (*)    Albumin 3.4 (*)    AST 44 (*)    All other components within normal limits  LIPASE, BLOOD - Abnormal; Notable for the following:    Lipase 1171 (*)    All other components within normal limits  CBC WITH DIFFERENTIAL  URINALYSIS, ROUTINE W REFLEX MICROSCOPIC  PREGNANCY, URINE   Imaging Review No results found.   EKG Interpretation None      [place] Criteria available to me at this time = 0  (no LDH available)  9:26 AM Patient's pain continues to be managed with PO dilaudid.    11:01 AM Admitted to [facility].  Dr Janece Canterbury.    MDM   Final diagnoses:  Pancreatitis    Pt with hx pancreatitis, diagnosed 3 months ago, presenting with similar symptoms.  Lipase 1171.  Pt admitted to hospitalist service for observation, IVF and pain control.  Pt and husband updated and agree with plan.      Clayton Bibles, PA-C 07/26/13 1142

## 2013-07-26 NOTE — Progress Notes (Signed)
Pt weepy because she misses her 60 month old son. Sat down and talked with her for a while.

## 2013-07-26 NOTE — ED Notes (Signed)
Gave patient urine cup and told her that we need a sample. Will check back in a few.

## 2013-07-27 ENCOUNTER — Observation Stay (HOSPITAL_COMMUNITY): Payer: Managed Care, Other (non HMO)

## 2013-07-27 DIAGNOSIS — E871 Hypo-osmolality and hyponatremia: Secondary | ICD-10-CM

## 2013-07-27 DIAGNOSIS — E876 Hypokalemia: Secondary | ICD-10-CM

## 2013-07-27 LAB — COMPREHENSIVE METABOLIC PANEL
ALBUMIN: 2.9 g/dL — AB (ref 3.5–5.2)
ALT: 96 U/L — ABNORMAL HIGH (ref 0–35)
AST: 77 U/L — ABNORMAL HIGH (ref 0–37)
Alkaline Phosphatase: 117 U/L (ref 39–117)
BUN: 4 mg/dL — AB (ref 6–23)
CHLORIDE: 106 meq/L (ref 96–112)
CO2: 25 meq/L (ref 19–32)
CREATININE: 0.8 mg/dL (ref 0.50–1.10)
Calcium: 8.1 mg/dL — ABNORMAL LOW (ref 8.4–10.5)
GFR calc Af Amer: >90 mL/min (ref >90)
Glucose, Bld: 121 mg/dL — ABNORMAL HIGH (ref 70–99)
Potassium: 4.2 mEq/L (ref 3.7–5.3)
Sodium: 139 mEq/L (ref 137–147)
Total Bilirubin: 0.4 mg/dL (ref 0.3–1.2)
Total Protein: 5.8 g/dL — ABNORMAL LOW (ref 6.0–8.3)

## 2013-07-27 LAB — LIPASE, BLOOD: LIPASE: 174 U/L — AB (ref 11–59)

## 2013-07-27 MED ORDER — GADOBENATE DIMEGLUMINE 529 MG/ML IV SOLN
15.0000 mL | Freq: Once | INTRAVENOUS | Status: AC | PRN
  Administered 2013-07-27: 15 mL via INTRAVENOUS

## 2013-07-27 NOTE — Progress Notes (Signed)
[facility] PROGRESS NOTE  Gerlene Glassburn RXV:[id number] DOB: 08/29/1975 DOA: 07/26/2013 PCP: [name], MD  Assessment/Plan  Pancreatitis with initial lipase of 1171. No EtOH use, new medications. S/p cholecystectomy and CT pancreas during last admission did not demonstrate mass, ductal stenosis, or other explanation. TG 137.  Lipase trending down, tolerating CLD, pain score 1/10 - FLD and advance as tolerated  - decrease IVF and continue antiemetics  - Oral dilaudid if tolerating PO, IV if vomiting  - MRCP - IgG4 level   Hypokalemia due to poor oral intake, resolved with potassium in IVF  Hyponatremia likely due to poor oral intake, resolved with IVF  Diet: FLD  Access: PIV  IVF: yes  Proph: lovenox   Code Status: full  Family Communication: patient  alone  Disposition Plan:  Awaiting MRCP results  Consultants:  [name] via phone, GI  Procedures:  MRCP  Antibiotics:  none   HPI/Subjective:  Pain 1/10 epigastrium without radiation today, feels better, tolerated CLD today.  Nausea without vomiting.  Small BM this AM   Objective: Filed Vitals:   07/26/13 1347 07/26/13 1706 07/26/13 2106 07/27/13 0610  BP: 105/44 102/66 113/67 112/73  Pulse: 67  66 61  Temp: 97.4 F (36.3 C)  98.1 F (36.7 C) 98.4 F (36.9 C)  TempSrc: Oral  Oral Oral  Resp: 16  16 15   Height:      Weight:      SpO2: 98%  100% 100%    Intake/Output Summary (Last 24 hours) at 07/27/13 1137 Last data filed at 07/27/13 0931  Gross per 24 hour  Intake 3088.33 ml  Output      0 ml  Net 3088.33 ml   Filed Weights   07/26/13 0738  Weight: 68.04 kg (150 lb)    Exam:   General:  CF, No acute distress  HEENT:  NCAT, MMM  Cardiovascular:  RRR, nl S1, S2 no mrg, 2+ pulses, warm extremities  Respiratory:  CTAB, no increased WOB  Abdomen:   NABS, soft, ND, mildly TTP in epigastrium without rebound or guarding  MSK:   Normal tone and bulk, no LEE  Neuro:  Grossly  intact  Data Reviewed: Basic Metabolic Panel:  Recent Labs Lab 07/26/13 0803 07/27/13 0600  NA 135* 139  K 3.4* 4.2  CL 99 106  CO2 24 25  GLUCOSE 99 121*  BUN 9 4*  CREATININE 0.73 0.80  CALCIUM 8.3* 8.1*   Liver Function Tests:  Recent Labs Lab 07/26/13 0803 07/27/13 0600  AST 44* 77*  ALT 27 96*  ALKPHOS 84 117  BILITOT 0.6 0.4  PROT 6.7 5.8*  ALBUMIN 3.4* 2.9*    Recent Labs Lab 07/26/13 0803 07/27/13 0600  LIPASE 1171* 174*   No results found for this basename: AMMONIA,  in the last 168 hours CBC:  Recent Labs Lab 07/26/13 0803  WBC 4.3  NEUTROABS 2.3  HGB 13.9  HCT 39.9  MCV 95.2  PLT 154   Cardiac Enzymes: No results found for this basename: CKTOTAL, CKMB, CKMBINDEX, TROPONINI,  in the last 168 hours BNP (last 3 results) No results found for this basename: PROBNP,  in the last 8760 hours CBG: No results found for this basename: GLUCAP,  in the last 168 hours  No results found for this or any previous visit (from the past 240 hour(s)).   Studies: No results found.  Scheduled Meds: . enoxaparin (LOVENOX) injection  40 mg Subcutaneous Q24H  . Norethindrone Acetate-Ethinyl  Estradiol  1 tablet Oral QHS   Continuous Infusions: . dextrose 5 % and 0.45 % NaCl with KCl 20 mEq/L 100 mL/hr at 07/26/13 2224    Active Problems:   Pancreatitis   Hypokalemia   Hyponatremia    Time spent: 30 min    Walid Haig, [facility]  [facility] Pager [phone]. If 7PM-7AM, please contact night-coverage at [email or web address], password [email or web address] 07/27/2013, 11:37 AM  LOS: 1 day

## 2013-07-27 NOTE — Discharge Summary (Signed)
Physician Discharge Summary  Cathy [name] [id number] DOB: 16-Jan-1976 DOA: 07/26/2013  PCP: Cathy Asal, MD  Admit date: 07/26/2013 Discharge date: 07/27/2013  Recommendations for Outpatient Follow-up:  1. Follow up with [name] in 2-3 weeks or sooner as needed for recurrent pancreatitis.  F/u IgG4 level.  Please repeat electrolytes including LFTs.    Discharge Diagnoses:  Active Problems:   Pancreatitis   Hypokalemia   Hyponatremia   Discharge Condition: stable, improved  Diet recommendation: full liquid and to advance to low fat as tolerated  Wt Readings from Last 3 Encounters:  07/26/13 68.04 kg (150 lb)  05/05/13 72.576 kg (160 lb)  04/09/12 70.988 kg (156 lb 8 oz)    History of present illness:  The patient is a 38 y.o. year-old female with history of seasonal allergies and one episode of idiopathic pancreatitis in 04/2013, followed by [name] who presents with abdominal pain. The patient was last at their baseline health two days ago. She states that she has been feeling well since her last episode of pancreatitis, and she has not been drinking alcohol, eaten any fatty foods in the last few days. She states she has not started any new medications recently. Yesterday, her appetite was diminished and she had some mild nausea. She ate some mashed potatoes and some macaroni, however, overnight around 3 AM, she awoke with severe epigastric pain with radiation to her back. She took 1 Dilaudid 4 mg tab, and approximately 15 minutes after taking the medication, her abdominal pain became severely worse, 10 out of 10. She states the pain is considerably worse than giving birth. She became diaphoretic and 6 her stomach. Her husband brought her to the emergency department. She has not been able to drink much.  In the emergency department, her abdominal pain started to resolve. Her vital signs were stable. Her labs were notable for sodium 135, potassium 3.4, calcium 8.3, AST 44, ALT  27, bili and AP wnl, lipase 1171 and albumin 3.4. Her CBC was within normal limits. UA and pregnancy test neg. She did not having any imaging studies. During her last admission, Doctor Collene Mares and suggested possibly doing an MRI. She is status post cholecystectomy.  Hospital Course:   Pancreatitis, initial lipase was 1171. She denied EtOH use, new medications.  She is s/p cholecystectomy. CT pancreasfrom 04/2013 did not demonstrate mass, ductal stenosis, or other explanation for her pancreatitis. TG 137.  She was admitted and placed on clear liquid diet with IVF.  She improved quickly and did not require IV pain medication, was able to tolerate PO dilaudid and stay hydrated.  Her lipase trended down to 174.  Discussed her case with [name], GI, who recommended MRCP and IgG4 level.  MRCP was normal and IgG4 is pending at the time of discharge.  [name] will follow up test results in clinic in 2 weeks.  Patient states she feels ready to go home and has adequate pain medication.   Hypokalemia due to poor oral intake, resolved with potassium in IVF  Hyponatremia likely due to poor oral intake, resolved with IVF  Transaminitis likely reflects inflammation from pancreatitis and was mild.  [name] to repeat in a few weeks  Consultants:  [name] via phone, GI Procedures:  MRCP Antibiotics:  none    Discharge Exam: Filed Vitals:   07/27/13 1315  BP: 116/58  Pulse: 70  Temp: 98.3 F (36.8 C)  Resp: 17   Filed Vitals:   07/26/13 1706 07/26/13  2106 07/27/13 0610 07/27/13 1315  BP: 102/66 113/67 112/73 116/58  Pulse:  66 61 70  Temp:  98.1 F (36.7 C) 98.4 F (36.9 C) 98.3 F (36.8 C)  TempSrc:  Oral Oral Oral  Resp:  16 15 17   Height:      Weight:      SpO2:  100% 100% 100%    General: CF, No acute distress  HEENT: NCAT, MMM  Cardiovascular: RRR, nl S1, S2 no mrg, 2+ pulses, warm extremities  Respiratory: CTAB, no increased WOB  Abdomen: NABS, soft, ND, mildly TTP in epigastrium  without rebound or guarding  MSK: Normal tone and bulk, no LEE  Neuro: Grossly intact   Discharge Instructions      Discharge Orders   Future Orders Complete By Expires   Call MD for:  difficulty breathing, headache or visual disturbances  As directed    Call MD for:  extreme fatigue  As directed    Call MD for:  hives  As directed    Call MD for:  persistant dizziness or light-headedness  As directed    Call MD for:  persistant nausea and vomiting  As directed    Call MD for:  severe uncontrolled pain  As directed    Call MD for:  temperature >100.4  As directed    Diet - low sodium heart healthy  As directed    Discharge instructions  As directed    Comments:     You were hospitalized with pancreatitis.  Your MRI of your pancreas was normal.  A blood test for autoimmune pancreatitis (IgG4) is pending and [name] will review this test result with you at your follow up appointment in 2 weeks.  Please drink plenty of fluids and gradually start to eat bland low fat foods as you are able.  If you develop severe abdominal pain not improving with your oral dilaudid and leading to nausea, vomiting, and dehydration, please return to the hospital.   Increase activity slowly  As directed        Medication List         HYDROmorphone 4 MG tablet  Commonly known as:  DILAUDID  Take 1 tablet (4 mg total) by mouth every 4 (four) hours as needed for severe pain.     JUNEL 1.5/30 1.5-30 MG-MCG tablet  Generic drug:  Norethindrone Acetate-Ethinyl Estradiol  Take 1 tablet by mouth daily.       Follow-up Information   Follow up with [name],[name], MD In 2 weeks.   Specialty:  Gastroenterology   Contact information:   [address], [place] [id number]        The results of significant diagnostics from this hospitalization (including imaging, microbiology, ancillary and laboratory) are listed below for reference.    Significant Diagnostic Studies: Mr 3d  Recon At Scanner  07/27/2013   CLINICAL DATA:  Recurrent pancreatitis. Status post cholecystectomy.  EXAM: MRI ABDOMEN WITHOUT AND WITH CONTRAST (INCLUDING MRCP)  TECHNIQUE: Multiplanar multisequence MR imaging of the abdomen was performed both before and after the administration of intravenous contrast. Heavily T2-weighted images of the biliary and pancreatic ducts were obtained, and three-dimensional MRCP images were rendered by post processing.  CONTRAST:  73mL MULTIHANCE GADOBENATE DIMEGLUMINE 529 MG/ML IV SOLN  COMPARISON:  CT scan 05/05/2013  FINDINGS: The common bile duct is normal in caliber given the prior cholecystectomy. The duct measures 7.4 mm in the porta hepatis and 5.7 mm in head  of the pancreas. The pancreatic duct is normal in caliber and has a normal course. No common bile duct stones are identified. No pancreatic mass or ampullary lesion.  The liver is normal. No focal hepatic lesions or intrahepatic biliary dilatation. A tiny cyst is noted in the right hepatic lobe. The spleen is normal in size. No focal lesions. The pancreas is normal. No inflammatory changes or mass lesions. No peripancreatic fluid collections. The adrenal glands and kidneys are normal.  The stomach, duodenum, visualized small bowel and visualized colon are unremarkable. No mesenteric or retroperitoneal mass or adenopathy. The aorta is normal in caliber. The major branch vessels are patent. A duplicated IVC is noted.  The bony structures are unremarkable.  IMPRESSION: Unremarkable MR examination of the abdomen and MRCP.   Electronically Signed   By: Kalman Jewels M.D.   On: 07/27/2013 12:12   Mr Abd W/wo Cm/mrcp  07/27/2013   CLINICAL DATA:  Recurrent pancreatitis. Status post cholecystectomy.  EXAM: MRI ABDOMEN WITHOUT AND WITH CONTRAST (INCLUDING MRCP)  TECHNIQUE: Multiplanar multisequence MR imaging of the abdomen was performed both before and after the administration of intravenous contrast. Heavily T2-weighted  images of the biliary and pancreatic ducts were obtained, and three-dimensional MRCP images were rendered by post processing.  CONTRAST:  107mL MULTIHANCE GADOBENATE DIMEGLUMINE 529 MG/ML IV SOLN  COMPARISON:  CT scan 05/05/2013  FINDINGS: The common bile duct is normal in caliber given the prior cholecystectomy. The duct measures 7.4 mm in the porta hepatis and 5.7 mm in head of the pancreas. The pancreatic duct is normal in caliber and has a normal course. No common bile duct stones are identified. No pancreatic mass or ampullary lesion.  The liver is normal. No focal hepatic lesions or intrahepatic biliary dilatation. A tiny cyst is noted in the right hepatic lobe. The spleen is normal in size. No focal lesions. The pancreas is normal. No inflammatory changes or mass lesions. No peripancreatic fluid collections. The adrenal glands and kidneys are normal.  The stomach, duodenum, visualized small bowel and visualized colon are unremarkable. No mesenteric or retroperitoneal mass or adenopathy. The aorta is normal in caliber. The major branch vessels are patent. A duplicated IVC is noted.  The bony structures are unremarkable.  IMPRESSION: Unremarkable MR examination of the abdomen and MRCP.   Electronically Signed   By: Kalman Jewels M.D.   On: 07/27/2013 12:12    Microbiology: No results found for this or any previous visit (from the past 240 hour(s)).   Labs: Basic Metabolic Panel:  Recent Labs Lab 07/26/13 0803 07/27/13 0600  NA 135* 139  K 3.4* 4.2  CL 99 106  CO2 24 25  GLUCOSE 99 121*  BUN 9 4*  CREATININE 0.73 0.80  CALCIUM 8.3* 8.1*   Liver Function Tests:  Recent Labs Lab 07/26/13 0803 07/27/13 0600  AST 44* 77*  ALT 27 96*  ALKPHOS 84 117  BILITOT 0.6 0.4  PROT 6.7 5.8*  ALBUMIN 3.4* 2.9*    Recent Labs Lab 07/26/13 0803 07/27/13 0600  LIPASE 1171* 174*   No results found for this basename: AMMONIA,  in the last 168 hours CBC:  Recent Labs Lab 07/26/13 0803   WBC 4.3  NEUTROABS 2.3  HGB 13.9  HCT 39.9  MCV 95.2  PLT 154   Cardiac Enzymes: No results found for this basename: CKTOTAL, CKMB, CKMBINDEX, TROPONINI,  in the last 168 hours BNP: BNP (last 3 results) No results found for this basename: PROBNP,  in the last 8760 hours CBG: No results found for this basename: GLUCAP,  in the last 168 hours  Time coordinating discharge: 45 minutes  Signed:  Juley Giovanetti  [facility] 07/27/2013, 2:35 PM

## 2013-07-27 NOTE — Progress Notes (Signed)
Explained to pt and fiance her discharge instructions. No prescriptions given. Pt states understanding.

## 2013-07-27 NOTE — Progress Notes (Signed)
UR completed. Patient changed to inpatient- requiring IVF@ 100cc/hr  

## 2013-08-08 LAB — IGG 1, 2, 3, AND 4
IgG 4: 38.9
IgG, Subclass 1: 685
IgG, Subclass 2: 331
IgG, Subclass 3: 35

## 2013-08-08 LAB — IGG: IGG (IMMUNOGLOBIN G), SERUM: 1140 mg/dL (ref 694–1618)

## 2013-12-07 ENCOUNTER — Other Ambulatory Visit: Payer: Self-pay | Admitting: Family Medicine

## 2013-12-07 DIAGNOSIS — J0110 Acute frontal sinusitis, unspecified: Secondary | ICD-10-CM

## 2013-12-10 ENCOUNTER — Encounter (INDEPENDENT_AMBULATORY_CARE_PROVIDER_SITE_OTHER): Payer: Self-pay

## 2013-12-10 ENCOUNTER — Ambulatory Visit
Admission: RE | Admit: 2013-12-10 | Discharge: 2013-12-10 | Disposition: A | Discharge status: Home / Self Care | Payer: 59 | Source: Ambulatory Visit | Attending: Family Medicine | Admitting: Family Medicine

## 2013-12-10 DIAGNOSIS — J0110 Acute frontal sinusitis, unspecified: Secondary | ICD-10-CM

## 2014-03-15 ENCOUNTER — Encounter (HOSPITAL_COMMUNITY): Payer: Self-pay | Admitting: Emergency Medicine

## 2014-05-14 DIAGNOSIS — K859 Acute pancreatitis without necrosis or infection, unspecified: Secondary | ICD-10-CM

## 2014-05-14 HISTORY — DX: Acute pancreatitis without necrosis or infection, unspecified: K85.90

## 2014-08-16 ENCOUNTER — Encounter (HOSPITAL_COMMUNITY): Payer: Self-pay | Admitting: General Practice

## 2014-08-17 ENCOUNTER — Ambulatory Visit (HOSPITAL_COMMUNITY): Payer: 59 | Admitting: Anesthesiology

## 2014-08-17 ENCOUNTER — Encounter (HOSPITAL_COMMUNITY): Payer: Self-pay

## 2014-08-17 ENCOUNTER — Other Ambulatory Visit (HOSPITAL_COMMUNITY): Admission: AD | Admit: 2014-08-17 | Payer: Self-pay | Source: Ambulatory Visit | Admitting: Obstetrics and Gynecology

## 2014-08-17 ENCOUNTER — Encounter (HOSPITAL_COMMUNITY)
Admission: RE | Disposition: A | Discharge status: Home / Self Care | Payer: Self-pay | Source: Ambulatory Visit | Attending: Obstetrics and Gynecology

## 2014-08-17 ENCOUNTER — Ambulatory Visit (HOSPITAL_COMMUNITY)
Admission: RE | Admit: 2014-08-17 | Discharge: 2014-08-17 | Disposition: A | Discharge status: Home / Self Care | Payer: 59 | Source: Ambulatory Visit | Attending: Obstetrics and Gynecology | Admitting: Obstetrics and Gynecology

## 2014-08-17 DIAGNOSIS — O034 Incomplete spontaneous abortion without complication: Secondary | ICD-10-CM | POA: Diagnosis not present

## 2014-08-17 DIAGNOSIS — Z3A08 8 weeks gestation of pregnancy: Secondary | ICD-10-CM | POA: Insufficient documentation

## 2014-08-17 DIAGNOSIS — Z882 Allergy status to sulfonamides status: Secondary | ICD-10-CM | POA: Insufficient documentation

## 2014-08-17 HISTORY — PX: DILATION AND EVACUATION: SHX1459

## 2014-08-17 LAB — CBC
HCT: 37.2 % (ref 36.0–46.0)
HEMOGLOBIN: 12.7 g/dL (ref 12.0–15.0)
MCH: 32.5 pg (ref 26.0–34.0)
MCHC: 34.1 g/dL (ref 30.0–36.0)
MCV: 95.1 fL (ref 78.0–100.0)
Platelets: 190 10*3/uL (ref 150–400)
RBC: 3.91 MIL/uL (ref 3.87–5.11)
RDW: 12.9 % (ref 11.5–15.5)
WBC: 5.4 10*3/uL (ref 4.0–10.5)

## 2014-08-17 SURGERY — DILATION AND EVACUATION, UTERUS
Anesthesia: Monitor Anesthesia Care | Site: Vagina

## 2014-08-17 MED ORDER — MIDAZOLAM HCL 2 MG/2ML IJ SOLN
INTRAMUSCULAR | Status: DC | PRN
  Administered 2014-08-17: 2 mg via INTRAVENOUS

## 2014-08-17 MED ORDER — OXYCODONE-ACETAMINOPHEN 7.5-325 MG PO TABS: 1.0000 | ORAL_TABLET | ORAL | Status: DC | PRN

## 2014-08-17 MED ORDER — SCOPOLAMINE 1 MG/3DAYS TD PT72
1.0000 | MEDICATED_PATCH | Freq: Once | TRANSDERMAL | Status: AC
  Administered 2014-08-17: 1.5 mg via TRANSDERMAL
  Administered 2014-08-17: 1 via TRANSDERMAL

## 2014-08-17 MED ORDER — GLYCOPYRROLATE 0.2 MG/ML IJ SOLN
INTRAMUSCULAR | Status: AC
  Filled 2014-08-17: qty 1

## 2014-08-17 MED ORDER — LIDOCAINE HCL 1 % IJ SOLN
INTRAMUSCULAR | Status: DC | PRN
  Administered 2014-08-17: 19 mL

## 2014-08-17 MED ORDER — LIDOCAINE HCL (CARDIAC) 20 MG/ML IV SOLN
INTRAVENOUS | Status: AC
  Filled 2014-08-17: qty 5

## 2014-08-17 MED ORDER — PROPOFOL INFUSION 10 MG/ML OPTIME
INTRAVENOUS | Status: DC | PRN
  Administered 2014-08-17: 150 ug/kg/min via INTRAVENOUS

## 2014-08-17 MED ORDER — ONDANSETRON HCL 4 MG/2ML IJ SOLN: 4.0000 mg | Freq: Once | INTRAMUSCULAR | Status: DC | PRN

## 2014-08-17 MED ORDER — MIDAZOLAM HCL 2 MG/2ML IJ SOLN
INTRAMUSCULAR | Status: AC
  Filled 2014-08-17: qty 2

## 2014-08-17 MED ORDER — CEFAZOLIN SODIUM-DEXTROSE 2-3 GM-% IV SOLR
2.0000 g | INTRAVENOUS | Status: AC
  Administered 2014-08-17: 2 g via INTRAVENOUS

## 2014-08-17 MED ORDER — DEXAMETHASONE SODIUM PHOSPHATE 10 MG/ML IJ SOLN
INTRAMUSCULAR | Status: DC | PRN
  Administered 2014-08-17: 5 mg via INTRAVENOUS

## 2014-08-17 MED ORDER — KETOROLAC TROMETHAMINE 30 MG/ML IJ SOLN
INTRAMUSCULAR | Status: AC
  Filled 2014-08-17: qty 1

## 2014-08-17 MED ORDER — CEFAZOLIN SODIUM-DEXTROSE 2-3 GM-% IV SOLR
INTRAVENOUS | Status: AC
  Filled 2014-08-17: qty 50

## 2014-08-17 MED ORDER — LIDOCAINE HCL 1 % IJ SOLN
INTRAMUSCULAR | Status: AC
  Filled 2014-08-17: qty 20

## 2014-08-17 MED ORDER — FENTANYL CITRATE 0.05 MG/ML IJ SOLN
INTRAMUSCULAR | Status: AC
  Filled 2014-08-17: qty 2

## 2014-08-17 MED ORDER — FENTANYL CITRATE 0.05 MG/ML IJ SOLN
INTRAMUSCULAR | Status: DC | PRN
  Administered 2014-08-17: 100 ug via INTRAVENOUS

## 2014-08-17 MED ORDER — LIDOCAINE HCL (CARDIAC) 20 MG/ML IV SOLN
INTRAVENOUS | Status: DC | PRN
  Administered 2014-08-17: 60 mg via INTRAVENOUS

## 2014-08-17 MED ORDER — PROPOFOL 10 MG/ML IV EMUL
INTRAVENOUS | Status: AC
  Filled 2014-08-17: qty 50

## 2014-08-17 MED ORDER — DEXAMETHASONE SODIUM PHOSPHATE 10 MG/ML IJ SOLN
INTRAMUSCULAR | Status: AC
  Filled 2014-08-17: qty 1

## 2014-08-17 MED ORDER — IBUPROFEN 800 MG PO TABS: 800.0000 mg | ORAL_TABLET | Freq: Three times a day (TID) | ORAL | Status: DC | PRN

## 2014-08-17 MED ORDER — LACTATED RINGERS IV SOLN
INTRAVENOUS | Status: DC
  Administered 2014-08-17: 13:00:00 via INTRAVENOUS

## 2014-08-17 MED ORDER — ONDANSETRON HCL 4 MG/2ML IJ SOLN
INTRAMUSCULAR | Status: DC | PRN
  Administered 2014-08-17: 4 mg via INTRAVENOUS

## 2014-08-17 MED ORDER — ONDANSETRON HCL 4 MG/2ML IJ SOLN
INTRAMUSCULAR | Status: AC
  Filled 2014-08-17: qty 2

## 2014-08-17 MED ORDER — FENTANYL CITRATE 0.05 MG/ML IJ SOLN: 25.0000 ug | INTRAMUSCULAR | Status: DC | PRN

## 2014-08-17 MED ORDER — KETOROLAC TROMETHAMINE 30 MG/ML IJ SOLN
INTRAMUSCULAR | Status: DC | PRN
  Administered 2014-08-17: 30 mg via INTRAVENOUS

## 2014-08-17 MED ORDER — SCOPOLAMINE 1 MG/3DAYS TD PT72
MEDICATED_PATCH | TRANSDERMAL | Status: AC
  Administered 2014-08-17: 1.5 mg via TRANSDERMAL
  Filled 2014-08-17: qty 1

## 2014-08-17 SURGICAL SUPPLY — 17 items
CLOTH BEACON ORANGE TIMEOUT ST (SAFETY) ×3 IMPLANT
DECANTER SPIKE VIAL GLASS SM (MISCELLANEOUS) ×3 IMPLANT
GLOVE BIO SURGEON STRL SZ7 (GLOVE) ×3 IMPLANT
GOWN STRL REUS W/TWL LRG LVL3 (GOWN DISPOSABLE) ×6 IMPLANT
KIT BERKELEY 1ST TRIMESTER 3/8 (MISCELLANEOUS) ×3 IMPLANT
NDL SPNL 20GX3.5 QUINCKE YW (NEEDLE) ×1 IMPLANT
NEEDLE SPNL 20GX3.5 QUINCKE YW (NEEDLE) ×3 IMPLANT
NS IRRIG 1000ML POUR BTL (IV SOLUTION) ×3 IMPLANT
PACK VAGINAL MINOR WOMEN LF (CUSTOM PROCEDURE TRAY) ×3 IMPLANT
PAD OB MATERNITY 4.3X12.25 (PERSONAL CARE ITEMS) ×3 IMPLANT
PAD PREP 24X48 CUFFED NSTRL (MISCELLANEOUS) ×3 IMPLANT
SET BERKELEY SUCTION TUBING (SUCTIONS) ×3 IMPLANT
TOWEL OR 17X24 6PK STRL BLUE (TOWEL DISPOSABLE) ×6 IMPLANT
VACURETTE 10 RIGID CVD (CANNULA) IMPLANT
VACURETTE 7MM CVD STRL WRAP (CANNULA) IMPLANT
VACURETTE 8 RIGID CVD (CANNULA) ×2 IMPLANT
VACURETTE 9 RIGID CVD (CANNULA) IMPLANT

## 2014-08-17 NOTE — H&P (Signed)
Cathy [name] is an 39 y.o. female. Presents at 8 weeks with incomplete spontaneous abortion for dilation and evacuation.  Tried cytotec over the weekend with poor results.2  Pertinent Gynecological History: Menses regulsr Bleeding: some spotting Contraception    none DES exposure none Blood transfusions: none Sexually transmitted diseases: none Previous GYN Procedures: laparoscopy Last mammog   na Last pap ok OB History: G2 P 1   Menstrual History: Menarche age: 33 No LMP recorded. Patient is not currently having periods (Reason: Other).    Past Medical History  Diagnosis Date  . Complication of anesthesia     states severe itching after gallbladder surgery  . Seasonal allergies   . 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 De Quervain's tenosynovitis, right 03/2012  . Pancreatitis     Past Surgical History  Procedure Laterality Date  . Laparoscopic endometriosis fulguration  1996  . Cesarean section  12/17/2011    Procedure: CESAREAN SECTION;  Surgeon: [name], MD;  Location: [facility];  Service: Gynecology;  Laterality: N/A;  . Cholecystectomy  11/06/2004  . Dorsal compartment release  04/09/2012    Procedure: RELEASE DORSAL COMPARTMENT (DEQUERVAIN);  Surgeon: [name], MD;  Location: [facility];  Service: Orthopedics;  Laterality: Right;  RELEASE 1ST DORSAL RIGHT  . Carpal tunnel release  04/09/2012    Procedure: CARPAL TUNNEL RELEASE;  Surgeon: [name], MD;  Location: [facility];  Service: Orthopedics;  Laterality: Right;    Family History  Problem Relation Age of Onset  . Colon cancer Mother 85  . Heart failure Father   . Hypertension Father   . Pancreatitis Neg Hx   . Pancreatic cancer Maternal Grandmother   . Colon cancer Paternal Grandmother   . Prostate cancer Maternal Grandfather     Social History:  reports that she has never smoked. She has never used smokeless tobacco. She reports that she drinks  alcohol. She reports that she does not use illicit drugs.  Allergies:  Allergies  Allergen Reactions  . Sudafed [Pseudoephedrine] Other (See Comments)    Makes pt delirious   . Adhesive [Tape] Rash  . Other Other (See Comments)    PERFUMED SOAPS/LOTIONS - IS BOTHERED BY FRAGRANCES Gives pt headaches  . Sulfa Antibiotics Nausea And Vomiting    No prescriptions prior to admission    ROS  Height 5\' 3"  (1.6 m), weight 160 lb (72.576 kg). Physical Exam  Constitutional: She is oriented to person, place, and time. She appears well-developed and well-nourished.  Cardiovascular: Normal rate, regular rhythm and normal heart sounds.   Respiratory: Effort normal and breath sounds normal.  GI: Soft. Bowel sounds are normal.  Genitourinary:  Gravid uterus 8 weeks in size adenexa clear  Neurological: She is alert and oriented to person, place, and time. She has normal reflexes.    No results found for this or any previous visit (from the past 24 hour(s)).  No results found.  Assessment/Plan:incomplete spontaneous abortion Plan dilation and evacution.  Risk discussed.  Infection.  Hemorrhage that could require transfusion with risk of AID's or hepatitis. Excessive bleeding could require hysterectomy.  Risk of perforation with injury to adjacent organs.  This could require exploratory surgery. Risk of DVT and PE.  Patient expresses understanding of indications, risks and alternatives.  Alesha Jaffee S 08/17/2014, 6:27 AM

## 2014-08-17 NOTE — Brief Op Note (Signed)
08/17/2014  1:29 PM  PATIENT:  Cathy [name]  39 y.o. female  PRE-OPERATIVE DIAGNOSIS:  missed ab, 1st trimester  POST-OPERATIVE DIAGNOSIS:  same  PROCEDURE:  Procedure(s): DILATATION AND EVACUATION (N/A)  SURGEON:  Surgeon(s) and Role:    * Arvella Nigh, MD - Primary  PHYSICIAN ASSISTANT:   ASSISTANTS: none   ANESTHESIA:   IV sedation and paracervical block  EBL:  Total I/O In: 600 [I.V.:600] Out: -   BLOOD ADMINISTERED:none  DRAINS: none   LOCAL MEDICATIONS USED:  XYLOCAINE   SPECIMEN:  Source of Specimen:  products of conception  DISPOSITION OF SPECIMEN:  PATHOLOGY  COUNTS:  YES  TOURNIQUET:  * No tourniquets in log *  DICTATION: .Other Dictation: Dictation Number [id number]  PLAN OF CARE: Discharge to home after PACU  PATIENT DISPOSITION:  PACU - hemodynamically stable.   Delay start of Pharmacological VTE agent (>24hrs) due to surgical blood loss or risk of bleeding: not applicable

## 2014-08-17 NOTE — Anesthesia Preprocedure Evaluation (Signed)
Anesthesia Evaluation  Patient identified by MRN, date of birth, ID band Patient awake    Reviewed: Allergy & Precautions, NPO status , Patient's Chart, lab work & pertinent test results  History of Anesthesia Complications (+) history of anesthetic complications  Airway Mallampati: II  TM Distance: >3 FB Neck ROM: Full    Dental no notable dental hx. (+) Dental Advisory Given   Pulmonary neg pulmonary ROS,  breath sounds clear to auscultation  Pulmonary exam normal       Cardiovascular negative cardio ROS  Rhythm:Regular Rate:Normal     Neuro/Psych negative neurological ROS  negative psych ROS   GI/Hepatic negative GI ROS, Neg liver ROS,   Endo/Other  negative endocrine ROS  Renal/GU negative Renal ROS  negative genitourinary   Musculoskeletal negative musculoskeletal ROS (+)   Abdominal   Peds negative pediatric ROS (+)  Hematology negative hematology ROS (+)   Anesthesia Other Findings   Reproductive/Obstetrics (+) Pregnancy Missed ab                             Anesthesia Physical Anesthesia Plan  ASA: II  Anesthesia Plan: MAC   Post-op Pain Management:    Induction: Intravenous  Airway Management Planned:   Additional Equipment:   Intra-op Plan:   Post-operative Plan:   Informed Consent: I have reviewed the patients History and Physical, chart, labs and discussed the procedure including the risks, benefits and alternatives for the proposed anesthesia with the patient or authorized representative who has indicated his/her understanding and acceptance.   Dental advisory given  Plan Discussed with: CRNA  Anesthesia Plan Comments:         Anesthesia Quick Evaluation

## 2014-08-17 NOTE — Anesthesia Postprocedure Evaluation (Signed)
  Anesthesia Post-op Note  Patient: Cathy [name]  Procedure(s) Performed: Procedure(s) (LRB): DILATATION AND EVACUATION (N/A)  Patient Location: PACU  Anesthesia Type: MAC  Level of Consciousness: awake and alert   Airway and Oxygen Therapy: Patient Spontanous Breathing  Post-op Pain: mild  Post-op Assessment: Post-op Vital signs reviewed, Patient's Cardiovascular Status Stable, Respiratory Function Stable, Patent Airway and No signs of Nausea or vomiting  Last Vitals:  Filed Vitals:   08/17/14 1400  BP: 111/59  Pulse: 70  Temp:   Resp: 16    Post-op Vital Signs: stable   Complications: No apparent anesthesia complications

## 2014-08-17 NOTE — Transfer of Care (Signed)
Immediate Anesthesia Transfer of Care Note  Patient: Cathy [name]  Procedure(s) Performed: Procedure(s): DILATATION AND EVACUATION (N/A)  Patient Location: PACU  Anesthesia Type:MAC  Level of Consciousness: awake, alert  and oriented  Airway & Oxygen Therapy: Patient Spontanous Breathing and Patient connected to nasal cannula oxygen  Post-op Assessment: Report given to RN, Post -op Vital signs reviewed and stable and Patient moving all extremities X 4  Post vital signs: Reviewed and stable  Last Vitals:  Filed Vitals:   08/17/14 1337  BP:   Pulse:   Temp: 36.8 C  Resp:     Complications: No apparent anesthesia complications

## 2014-08-17 NOTE — H&P (Signed)
  History and physical exam unchanged 

## 2014-08-17 NOTE — Discharge Instructions (Signed)
DISCHARGE INSTRUCTIONS: D&C / D&E The following instructions have been prepared to help you care for yourself upon your return home.  No Ibuprofen containing products (ie. Advil, Aleve, Motrin, etc. Until after 7:30 pm tonight).   Personal hygiene:  Use sanitary pads for vaginal drainage, not tampons.  Shower the day after your procedure.  NO tub baths, pools or Jacuzzis for 2-3 weeks.  Wipe front to back after using the bathroom.  Activity and limitations:  Do NOT drive or operate any equipment for 24 hours. The effects of anesthesia are still present and drowsiness may result.  Do NOT rest in bed all day.  Walking is encouraged.  Walk up and down stairs slowly.  You may resume your normal activity in one to two days or as indicated by your physician.  Sexual activity: NO intercourse for at least 2 weeks after the procedure, or as indicated by your physician.  Diet: Eat a light meal as desired this evening. You may resume your usual diet tomorrow.  Return to work: You may resume your work activities in one to two days or as indicated by your doctor.  What to expect after your surgery: Expect to have vaginal bleeding/discharge for 2-3 days and spotting for up to 10 days. It is not unusual to have soreness for up to 1-2 weeks. You may have a slight burning sensation when you urinate for the first day. Mild cramps may continue for a couple of days. You may have a regular period in 2-6 weeks.  Call your doctor for any of the following:  Excessive vaginal bleeding, saturating and changing one pad every hour.  Inability to urinate 6 hours after discharge from hospital.  Pain not relieved by pain medication.  Fever of 100.4 F or greater.  Unusual vaginal discharge or odor.   Call for an appointment:    Patients signature: ______________________  Nurses signature ________________________  Support person's signature_______________________

## 2014-08-18 ENCOUNTER — Encounter (HOSPITAL_COMMUNITY): Payer: Self-pay | Admitting: Obstetrics and Gynecology

## 2014-08-18 NOTE — Op Note (Signed)
NAME:  Cathy [name], Cathy [name] NO.:  [id number]  MEDICAL RECORD NO.:  [id number]  LOCATION:  [facility]                          FACILITY:  [facility]  PHYSICIAN:  [name], M.D.   DATE OF BIRTH:  Oct 12, 1975  DATE OF PROCEDURE:  08/17/2014 DATE OF DISCHARGE:  08/17/2014                              OPERATIVE REPORT   PREOPERATIVE DIAGNOSIS:  Incomplete spontaneous abortion.  POSTOPERATIVE DIAGNOSIS:  Incomplete spontaneous abortion.  OPERATIVE PROCEDURE:  Paracervical block with dilation and evacuation.  SURGEON:  [name], M.D.  ANESTHESIA:  Paracervical block with sedation.  ESTIMATED BLOOD LOSS:  Minimal.  PACKS AND DRAINS:  None.  INTRAOPERATIVE BLOOD PLACED:  None.  COMPLICATIONS:  None.  INDICATION:  Dictated in history and physical.  PROCEDURE IN DETAIL:  As follows; the patient was taken to the OR and placed in a supine position.  After sedation, the patient was placed in a dorsal lithotomy position using Allen stirrups.  The patient was then draped in sterile field.  A speculum was placed in the vaginal vault. The cervix and vagina cleansed out with Betadine.  Anterior lip of the cervix anesthetized with 1% Xylocaine and secured with a single-tooth tenaculum.  Paracervical block 1% Xylocaine was used, approximately 18 mL were used.  Uterus sounded to approximately 9 cm.  Cervix serially dilated to a size 27 Pratt dilator.  A size 8 curved suction curette was introduced and intrauterine cavity contents removed using suction curetting till no additional tissue was obtained.  Repeat sharp curetting at this point in time revealed all quadrants to be clear. Uterus was contracting down well.  Repeat suction curetting was also clear.  There were no signs of perforation or active bleeding.  Single- tooth tenaculum and speculum were then removed.  The patient was taken out of the dorsal lithotomy position.  Once alert, transferred to recovery room in good  condition.  Sponge, instrument, and needle counts were correct by circulating nurse.     [name], M.D.     JSM/MEDQ  D:  08/17/2014  T:  08/18/2014  Job:  [id number]

## 2014-12-28 IMAGING — CT CT ABD-PELV W/ CM
1 of 2 series · 15 of 32 positions shown, 19 images · IV contrast (OMNIPAQUE 300)
Comparison: None.

CLINICAL DATA: Cholecystectomy.  Abdominal pain.  Nausea.

EXAM:
CT ABDOMEN AND PELVIS WITH CONTRAST
TECHNIQUE: Multidetector CT imaging of the abdomen and pelvis was performed
using the standard protocol following bolus administration of
intravenous contrast.
CONTRAST:  50mL OMNIPAQUE IOHEXOL 300 MG/ML SOLN, 100mL OMNIPAQUE
IOHEXOL 300 MG/ML SOLN

[Series 2: abd/pel with · axial · 0.69mm/px · z∈[+1079,+1469]mm · 15 of 86 slices shown, 19 images]
[im 4/86  soft-tissue]
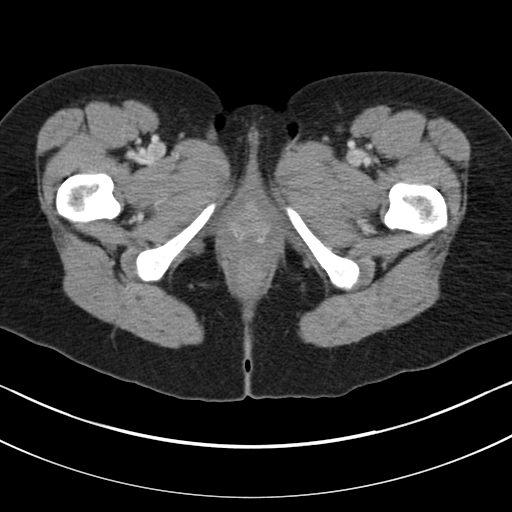
[im 4/86  bone]
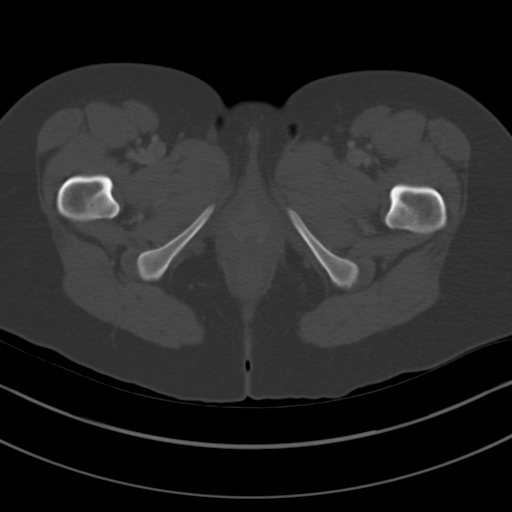
[im 11/86  soft-tissue]
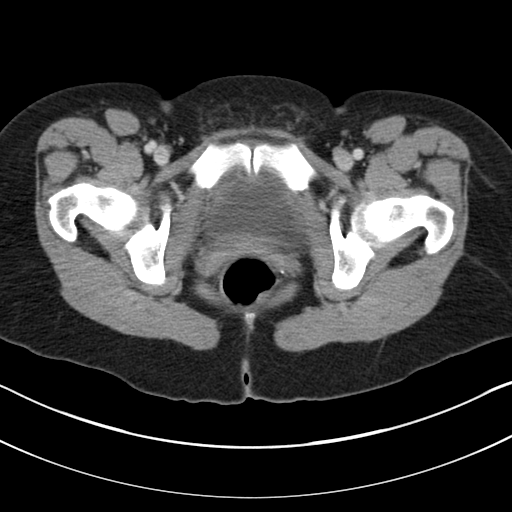
[im 18/86  soft-tissue]
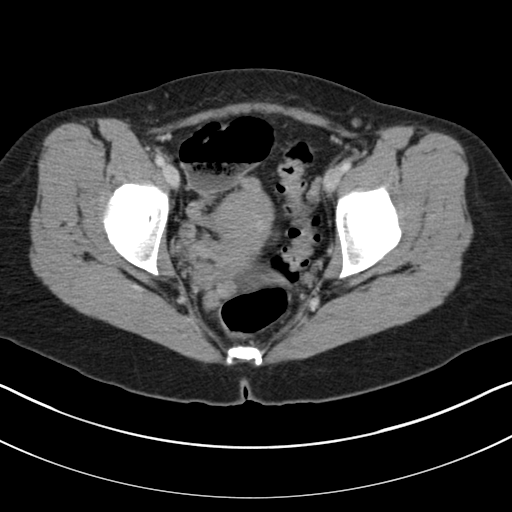
[im 24/86  soft-tissue]
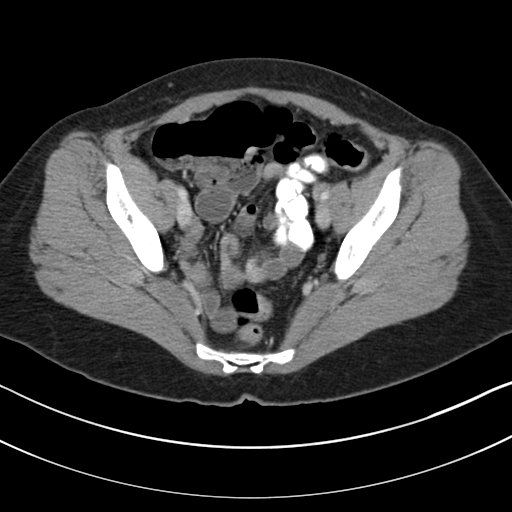
[im 31/86  soft-tissue]
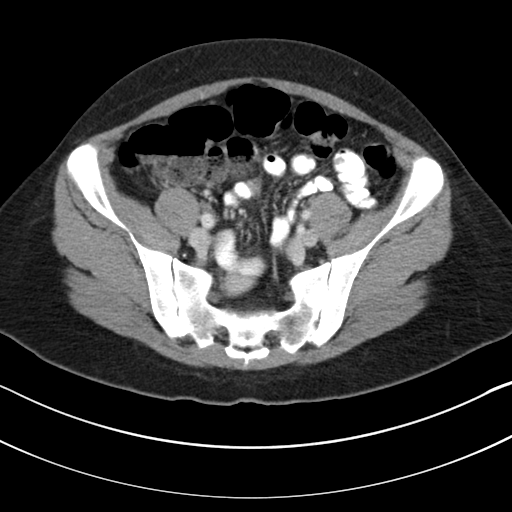
[im 38/86  soft-tissue]
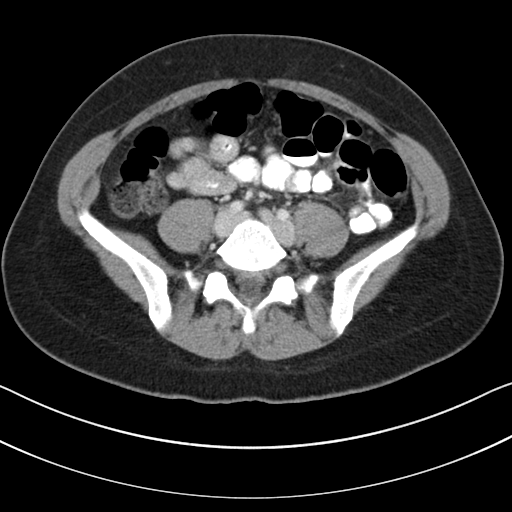
[im 45/86  soft-tissue]
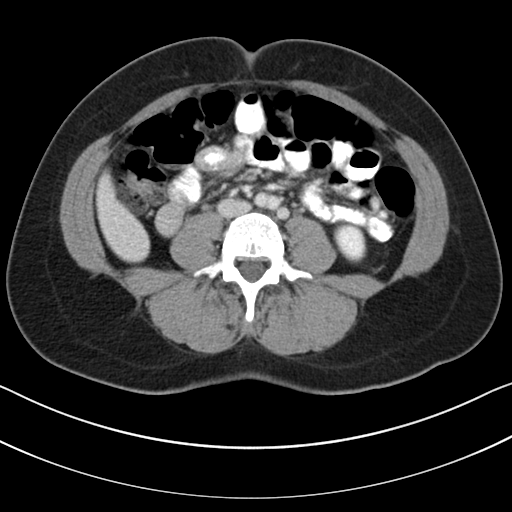
[im 48/86  soft-tissue]
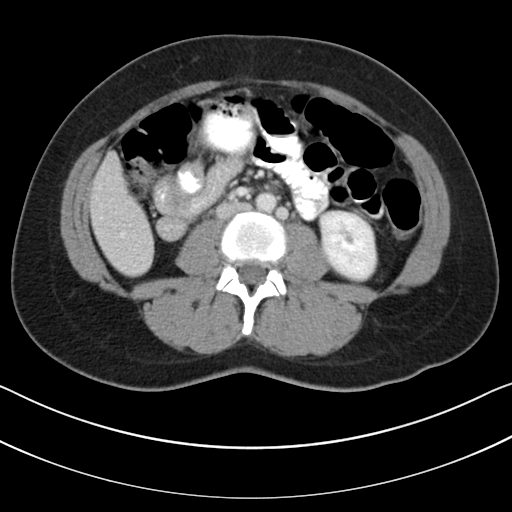
[im 55/86  soft-tissue]
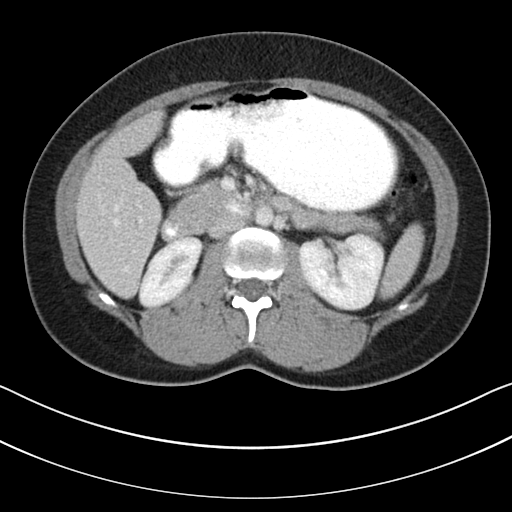
[im 55/86  bone]
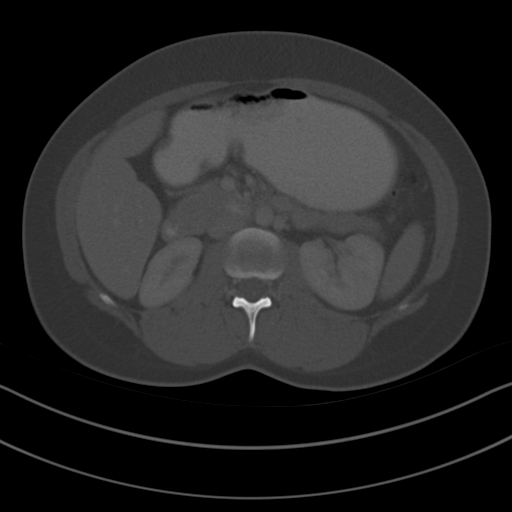
[im 62/86  soft-tissue]
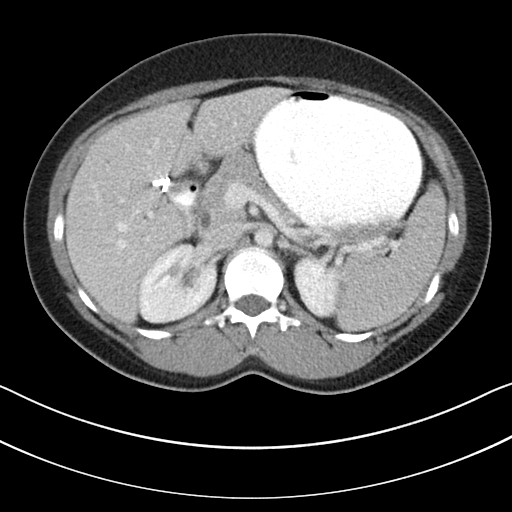
[im 69/86  soft-tissue]
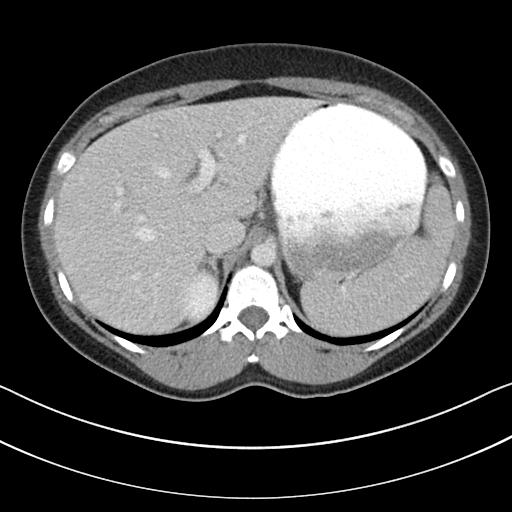
[im 72/86  lung]
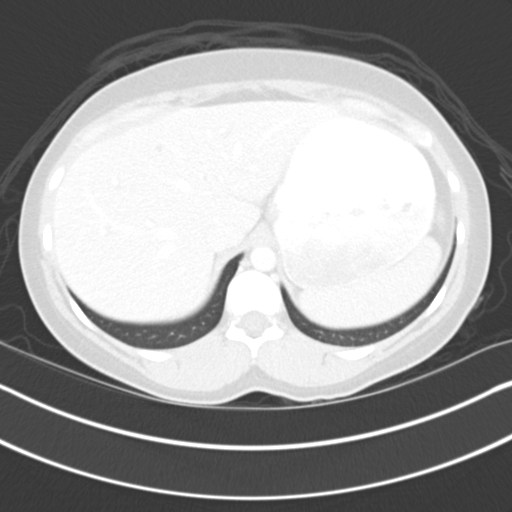
[im 75/86  soft-tissue]
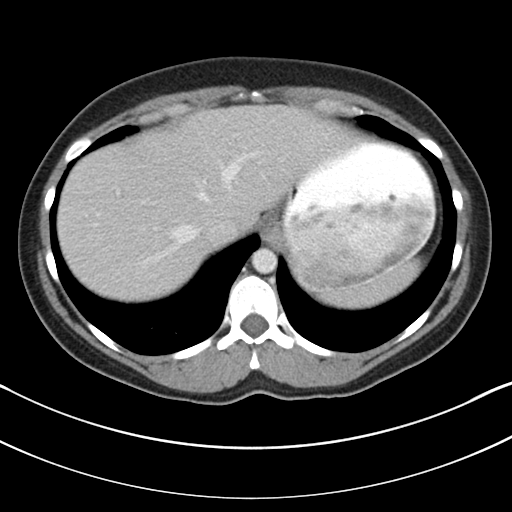
[im 75/86  lung]
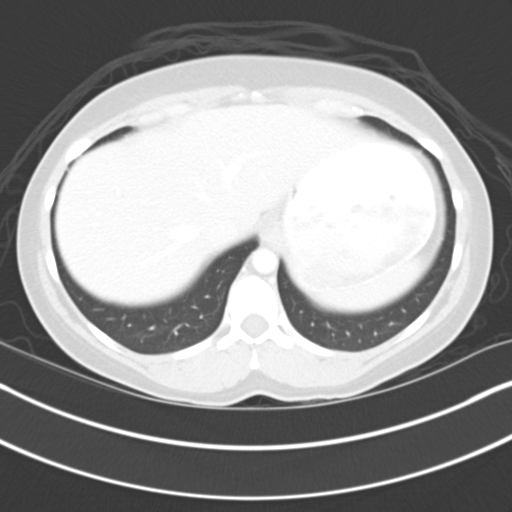
[im 79/86  lung]
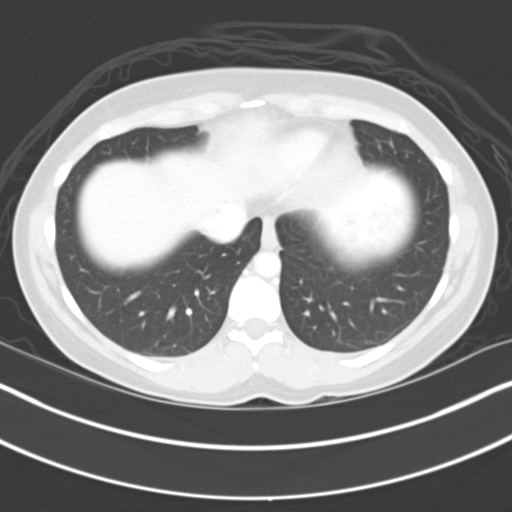
[im 82/86  soft-tissue]
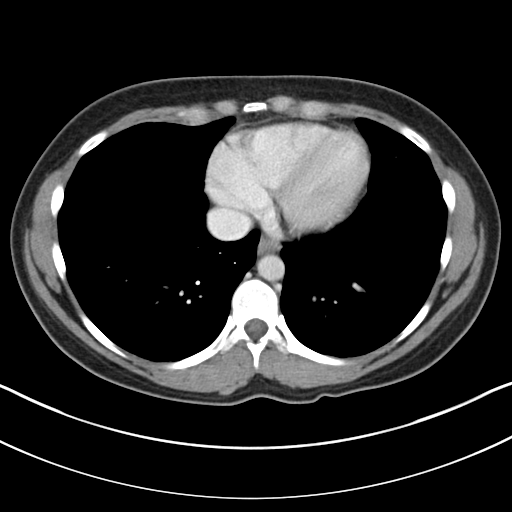
[im 82/86  lung]
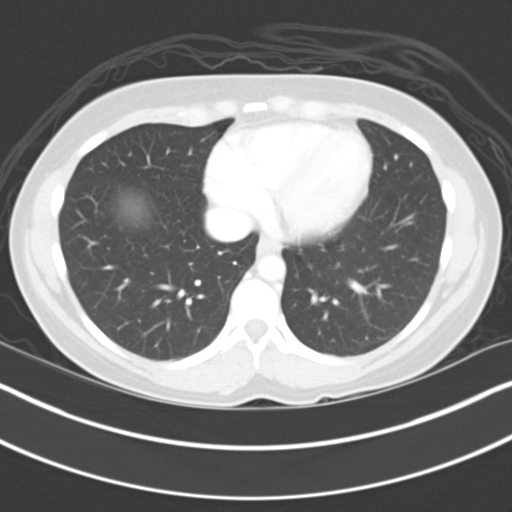

[15 of 32 positions shown; findings below may reference images not displayed]

FINDINGS: Lung Bases: Negative.

Liver: Tiny low-density lesion in the knee right hepatic lobe likely
represents a cyst. Mild intrahepatic biliary ductal dilated patch
that normal appearance of the liver status post cholecystectomy with
mild intrahepatic biliary ductal dilation.

Spleen:  Normal.

Gallbladder:  Cholecystectomy

Common bile duct:  Normal.

Pancreas:  Normal.

Adrenal glands:  Normal.

Kidneys: Normal enhancement. No calculi. Both ureters appear within
normal limits.

Stomach:  Distended with oral contrast.

Small bowel:  Normal.  No obstruction.  No mesenteric adenopathy.

Colon: Fluid levels in the proximal colon are abnormal but
nonspecific. There is no mural thickening. The appendix is not
identified. No right lower quadrant inflammatory changes. Distal
colon appears normal.

Pelvic Genitourinary: Normal appearance of the uterus and adnexa. No
free fluid. Urinary bladder is collapsed.

Bones: Osteitis condensans ilii on the left and mild left SI joint
degenerative disease. No aggressive osseous lesions.

Vasculature: Double barrel IVC incidentally noted.

Body Wall:  Tiny fat containing periumbilical hernia.
IMPRESSION: 1. Air-fluid levels in the proximal colon are abnormal but
nonspecific, most commonly associated with enteric infection.
2. Cholecystectomy.

## 2015-02-01 ENCOUNTER — Encounter (INDEPENDENT_AMBULATORY_CARE_PROVIDER_SITE_OTHER): Payer: Self-pay | Admitting: *Deleted

## 2015-03-04 ENCOUNTER — Encounter (HOSPITAL_COMMUNITY): Payer: Self-pay | Admitting: *Deleted

## 2015-03-04 NOTE — H&P (Signed)
NAME:  Cathy [name], Cathy [name] NO.:  [id number]  MEDICAL RECORD NO.:  [id number]  LOCATION:  [facility]                         FACILITY:  [facility]  PHYSICIAN:  [name], M.D.   DATE OF BIRTH:  11-03-1975  DATE OF ADMISSION:  03/07/2015 DATE OF DISCHARGE:                             HISTORY & PHYSICAL   HISTORY OF PRESENT ILLNESS:  The patient is a 39 year old, gravida 3, para 1, abortus 1 female presents for dilation and evacuation.  She had an ultrasound done on Friday that revealed a fetal pole consistent with approximately 8 weeks, no heart activity was noted.  In view of this, she comes in now for dilation and evacuation.  ALLERGIES:  SULFA.  MEDICATIONS:  She is on progesterone as well as prenatal vitamins.  PAST MEDICAL HISTORY:  She has a history of migraine headaches.  PAST SURGICAL HISTORY:  She has had her gallbladder removed.  She has had 1 cesarean section.  She has had laparoscopy.  Had carpal tunnel surgery.  SOCIAL HISTORY:  No tobacco or alcohol use.  FAMILY HISTORY:  Noncontributory.  REVIEW OF SYSTEMS:  Noncontributory.  PHYSICAL EXAMINATION:  VITAL SIGNS:  The patient is afebrile, stable vital signs. HEENT:  The patient is normocephalic.  Pupils equal, round, reactive to light and accommodation.  Extraocular movements were intact.  Sclerae and conjunctivae clear.  Oropharynx clear. NECK:  Not examined. BREASTS:  Not examined. ABDOMEN:  Benign.  No mass, organomegaly, or tenderness. PELVIC:  Normal external genitalia.  Vaginal cuff is clear.  Cervix unremarkable.  Uterus is normal size, shape, and contour.  Adnexa free of masses or tenderness. EXTREMITIES:  Trace edema. NEUROLOGIC:  Grossly within normal limits.  IMPRESSION:  Nonviable first trimester pregnancy.  PLAN:  The patient will undergo dilatation and evacuation.  The risks of surgery have been discussed including the risk of infection.  The risk of hemorrhage that could require  transfusion with the risk of AIDS or hepatitis. Excessive bleeding could require hysterectomy.  The risk of perforation that could lead to injury to adjacent organs requiring exploratory surgery.  Risk of deep venous thrombosis and pulmonary embolus.  The patient's blood type is O positive.  RhoGAM will not be required.     [name], M.D.     JSM/MEDQ  D:  03/04/2015  T:  03/04/2015  Job:  [id number]

## 2015-03-04 NOTE — H&P (Signed)
  Patient name Cathy [name], Cathy [name] DICTATION#  [id number] CSN# [id number]  [facility], MD 03/04/2015 10:30 AM

## 2015-03-07 ENCOUNTER — Ambulatory Visit (HOSPITAL_COMMUNITY): Payer: 59

## 2015-03-07 ENCOUNTER — Ambulatory Visit (HOSPITAL_COMMUNITY): Payer: 59 | Admitting: Anesthesiology

## 2015-03-07 ENCOUNTER — Encounter (HOSPITAL_COMMUNITY): Payer: Self-pay

## 2015-03-07 ENCOUNTER — Ambulatory Visit (HOSPITAL_COMMUNITY)
Admission: RE | Admit: 2015-03-07 | Discharge: 2015-03-07 | Disposition: A | Discharge status: Home / Self Care | Payer: 59 | Source: Ambulatory Visit | Attending: Obstetrics and Gynecology | Admitting: Obstetrics and Gynecology

## 2015-03-07 ENCOUNTER — Encounter (HOSPITAL_COMMUNITY)
Admission: RE | Disposition: A | Discharge status: Home / Self Care | Payer: Self-pay | Source: Ambulatory Visit | Attending: Obstetrics and Gynecology

## 2015-03-07 DIAGNOSIS — O021 Missed abortion: Secondary | ICD-10-CM | POA: Insufficient documentation

## 2015-03-07 DIAGNOSIS — Z419 Encounter for procedure for purposes other than remedying health state, unspecified: Secondary | ICD-10-CM

## 2015-03-07 HISTORY — PX: DILATION AND EVACUATION: SHX1459

## 2015-03-07 LAB — CBC
HEMATOCRIT: 37.7 % (ref 36.0–46.0)
Hemoglobin: 12.8 g/dL (ref 12.0–15.0)
MCH: 32.1 pg (ref 26.0–34.0)
MCHC: 34 g/dL (ref 30.0–36.0)
MCV: 94.5 fL (ref 78.0–100.0)
Platelets: 178 10*3/uL (ref 150–400)
RBC: 3.99 MIL/uL (ref 3.87–5.11)
RDW: 13.4 % (ref 11.5–15.5)
WBC: 6.7 10*3/uL (ref 4.0–10.5)

## 2015-03-07 SURGERY — DILATION AND EVACUATION, UTERUS
Anesthesia: Monitor Anesthesia Care | Site: Vagina

## 2015-03-07 MED ORDER — ONDANSETRON HCL 4 MG/2ML IJ SOLN: 4.0000 mg | Freq: Once | INTRAMUSCULAR | Status: DC | PRN

## 2015-03-07 MED ORDER — LACTATED RINGERS IV SOLN
INTRAVENOUS | Status: DC
  Administered 2015-03-07: 125 mL/h via INTRAVENOUS

## 2015-03-07 MED ORDER — PROPOFOL 500 MG/50ML IV EMUL
INTRAVENOUS | Status: DC | PRN
  Administered 2015-03-07: 150 ug/kg/min via INTRAVENOUS

## 2015-03-07 MED ORDER — FENTANYL CITRATE (PF) 100 MCG/2ML IJ SOLN
INTRAMUSCULAR | Status: AC
  Filled 2015-03-07: qty 4

## 2015-03-07 MED ORDER — CEFAZOLIN SODIUM-DEXTROSE 2-3 GM-% IV SOLR
INTRAVENOUS | Status: AC
  Filled 2015-03-07: qty 50

## 2015-03-07 MED ORDER — MIDAZOLAM HCL 2 MG/2ML IJ SOLN
INTRAMUSCULAR | Status: AC
  Filled 2015-03-07: qty 4

## 2015-03-07 MED ORDER — SCOPOLAMINE 1 MG/3DAYS TD PT72
1.0000 | MEDICATED_PATCH | Freq: Once | TRANSDERMAL | Status: DC
Start: 2015-03-07 — End: 2015-03-07
  Administered 2015-03-07: 1.5 mg via TRANSDERMAL

## 2015-03-07 MED ORDER — ONDANSETRON HCL 4 MG/2ML IJ SOLN
INTRAMUSCULAR | Status: DC | PRN
  Administered 2015-03-07: 4 mg via INTRAVENOUS

## 2015-03-07 MED ORDER — SCOPOLAMINE 1 MG/3DAYS TD PT72
MEDICATED_PATCH | TRANSDERMAL | Status: AC
  Administered 2015-03-07: 1.5 mg via TRANSDERMAL
  Filled 2015-03-07: qty 1

## 2015-03-07 MED ORDER — CHLOROPROCAINE HCL 1 % IJ SOLN
INTRAMUSCULAR | Status: AC
  Filled 2015-03-07: qty 30

## 2015-03-07 MED ORDER — FENTANYL CITRATE (PF) 100 MCG/2ML IJ SOLN
INTRAMUSCULAR | Status: DC | PRN
  Administered 2015-03-07: 100 ug via INTRAVENOUS

## 2015-03-07 MED ORDER — ONDANSETRON HCL 4 MG/2ML IJ SOLN
INTRAMUSCULAR | Status: AC
  Filled 2015-03-07: qty 2

## 2015-03-07 MED ORDER — LIDOCAINE HCL (CARDIAC) 20 MG/ML IV SOLN
INTRAVENOUS | Status: AC
  Filled 2015-03-07: qty 5

## 2015-03-07 MED ORDER — LIDOCAINE HCL (CARDIAC) 20 MG/ML IV SOLN
INTRAVENOUS | Status: DC | PRN
  Administered 2015-03-07: 100 mg via INTRAVENOUS

## 2015-03-07 MED ORDER — KETOROLAC TROMETHAMINE 30 MG/ML IJ SOLN
INTRAMUSCULAR | Status: DC | PRN
  Administered 2015-03-07: 30 mg via INTRAVENOUS

## 2015-03-07 MED ORDER — CEFAZOLIN SODIUM-DEXTROSE 2-3 GM-% IV SOLR
2.0000 g | INTRAVENOUS | Status: AC
  Administered 2015-03-07: 2 g via INTRAVENOUS

## 2015-03-07 MED ORDER — DEXAMETHASONE SODIUM PHOSPHATE 10 MG/ML IJ SOLN
INTRAMUSCULAR | Status: DC | PRN
  Administered 2015-03-07: 4 mg via INTRAVENOUS

## 2015-03-07 MED ORDER — PROPOFOL 10 MG/ML IV BOLUS
INTRAVENOUS | Status: AC
  Filled 2015-03-07: qty 20

## 2015-03-07 MED ORDER — CHLOROPROCAINE HCL 1 % IJ SOLN
INTRAMUSCULAR | Status: DC | PRN
  Administered 2015-03-07: 20 mL

## 2015-03-07 MED ORDER — DEXAMETHASONE SODIUM PHOSPHATE 4 MG/ML IJ SOLN
INTRAMUSCULAR | Status: AC
  Filled 2015-03-07: qty 1

## 2015-03-07 MED ORDER — FENTANYL CITRATE (PF) 100 MCG/2ML IJ SOLN: 25.0000 ug | INTRAMUSCULAR | Status: DC | PRN

## 2015-03-07 MED ORDER — MIDAZOLAM HCL 5 MG/5ML IJ SOLN
INTRAMUSCULAR | Status: DC | PRN
  Administered 2015-03-07: 2 mg via INTRAVENOUS

## 2015-03-07 SURGICAL SUPPLY — 17 items
CLOTH BEACON ORANGE TIMEOUT ST (SAFETY) ×3 IMPLANT
DECANTER SPIKE VIAL GLASS SM (MISCELLANEOUS) ×3 IMPLANT
GLOVE BIO SURGEON STRL SZ7 (GLOVE) ×3 IMPLANT
GOWN STRL REUS W/TWL LRG LVL3 (GOWN DISPOSABLE) ×6 IMPLANT
KIT BERKELEY 1ST TRIMESTER 3/8 (MISCELLANEOUS) ×3 IMPLANT
NDL SPNL 20GX3.5 QUINCKE YW (NEEDLE) ×1 IMPLANT
NEEDLE SPNL 20GX3.5 QUINCKE YW (NEEDLE) ×3 IMPLANT
NS IRRIG 1000ML POUR BTL (IV SOLUTION) ×3 IMPLANT
PACK VAGINAL MINOR WOMEN LF (CUSTOM PROCEDURE TRAY) ×3 IMPLANT
PAD OB MATERNITY 4.3X12.25 (PERSONAL CARE ITEMS) ×3 IMPLANT
PAD PREP 24X48 CUFFED NSTRL (MISCELLANEOUS) ×3 IMPLANT
SET BERKELEY SUCTION TUBING (SUCTIONS) ×3 IMPLANT
TOWEL OR 17X24 6PK STRL BLUE (TOWEL DISPOSABLE) ×6 IMPLANT
VACURETTE 10 RIGID CVD (CANNULA) IMPLANT
VACURETTE 7MM CVD STRL WRAP (CANNULA) IMPLANT
VACURETTE 8 RIGID CVD (CANNULA) ×2 IMPLANT
VACURETTE 9 RIGID CVD (CANNULA) IMPLANT

## 2015-03-07 NOTE — Op Note (Signed)
NAME:  Cathy [name], Cathy [name] NO.:  [id number]  MEDICAL RECORD NO.:  [id number]  LOCATION:  [facility]                          FACILITY:  [facility]  PHYSICIAN:  [name], M.D.   DATE OF BIRTH:  1975-08-19  DATE OF PROCEDURE:  03/07/2015 DATE OF DISCHARGE:                              OPERATIVE REPORT   PREOPERATIVE DIAGNOSIS:  Nonviable first trimester pregnancy.  POSTOPERATIVE DIAGNOSIS:  Nonviable first trimester pregnancy.  OPERATIVE PROCEDURE:  Paracervical block, cervical dilatation, uterine evacuation and curettage.  SURGEON:  [name], M.D.  ANESTHESIA:  Paracervical block with sedation.  ESTIMATED BLOOD LOSS:  150 mL.  PACKS AND DRAINS:  None.  INTRAOPERATIVE BLOOD PLACED:  None.  COMPLICATIONS:  None.  INDICATIONS:  As previously dictated.  DESCRIPTION OF PROCEDURE:  The patient was taken to the OR and placed in supine position.  After sedation, she was placed in a dorsal lithotomy position using the Allen stirrups.  The patient was draped in sterile field.  A speculum was placed in the vaginal vault.  Cervix and vagina were cleansed out with Betadine.  Anterior lip of the cervix was anesthetized with 1% Xylocaine, secured with a single-tooth tenaculum. A paracervical block was instituted using 1% Nesacaine.  At this time, the uterus sounded to approximately 10 cm.  The cervix was serially dilated to a size 25 Pratt dilator.  Size 8 curved suction curette was introduced.  Intrauterine contents were removed using suction curetting. At this time, the suction curette was removed.  A curette was used to do a curetting of the intrauterine cavity.  It felt like some tissue was remaining.  We did another suction curetting.  We obtained a small amount of additional tissue.  Repeat sharp curetting revealed all quadrants to be clear and the uterus was contracting down well.  Minimal bleeding was encountered.  At this point in time, the  single-tooth tenaculum and speculum were then removed.  The patient was taken out of the dorsal lithotomy position, once alert, transferred to the recovery room in good condition.  Sponges, instrument, and needle count was reported as correct by circulating nurse.     [name], M.D.     JSM/MEDQ  D:  03/07/2015  T:  03/07/2015  Job:  [id number]

## 2015-03-07 NOTE — Anesthesia Postprocedure Evaluation (Signed)
  Anesthesia Post-op Note  Patient: Cathy [name]  Procedure(s) Performed: Procedure(s) (LRB): DILATATION AND EVACUATION (N/A)  Patient Location: PACU  Anesthesia Type: MAC  Level of Consciousness: awake and alert   Airway and Oxygen Therapy: Patient Spontanous Breathing  Post-op Pain: mild  Post-op Assessment: Post-op Vital signs reviewed, Patient's Cardiovascular Status Stable, Respiratory Function Stable, Patent Airway and No signs of Nausea or vomiting  Last Vitals:  Filed Vitals:   03/07/15 1153  BP: 117/68  Pulse: 73  Temp: 36.8 C  Resp: 20    Post-op Vital Signs: stable   Complications: No apparent anesthesia complications

## 2015-03-07 NOTE — Brief Op Note (Signed)
03/07/2015  1:28 PM  PATIENT:  Kinzlee Selvy  39 y.o. female  PRE-OPERATIVE DIAGNOSIS:  missed abortion  POST-OPERATIVE DIAGNOSIS:  missed abortion  PROCEDURE:  Procedure(s) with comments: DILATATION AND EVACUATION (N/A) - nonviable at 8 weeks O positive  SURGEON:  Surgeon(s) and Role:    * Arvella Nigh, MD - Primary  PHYSICIAN ASSISTANT:   ASSISTANTS: none   ANESTHESIA:   IV sedation and paracervical block  EBL:  Total I/O In: 400 [I.V.:400] Out: 25 [Blood:25]  BLOOD ADMINISTERED:none  DRAINS: none   LOCAL MEDICATIONS USED:  OTHER nesicaine  SPECIMEN:  Source of Specimen:  products of conception  DISPOSITION OF SPECIMEN:  PATHOLOGY  COUNTS:  YES  TOURNIQUET:  * No tourniquets in log *  DICTATION: .Other Dictation: Dictation Number [id number]  PLAN OF CARE: Discharge to home after PACU  PATIENT DISPOSITION:  PACU - hemodynamically stable.   Delay start of Pharmacological VTE agent (>24hrs) due to surgical blood loss or risk of bleeding: not applicable

## 2015-03-07 NOTE — H&P (Signed)
  History and physical exam unchanged 

## 2015-03-07 NOTE — Anesthesia Preprocedure Evaluation (Addendum)
Anesthesia Evaluation  Patient identified by MRN, date of birth, ID band Patient awake    Reviewed: Allergy & Precautions, NPO status , Patient's Chart, lab work & pertinent test results  History of Anesthesia Complications Negative for: history of anesthetic complications  Airway Mallampati: II  TM Distance: >3 FB Neck ROM: Full    Dental no notable dental hx. (+) Dental Advisory Given   Pulmonary neg pulmonary ROS,    Pulmonary exam normal breath sounds clear to auscultation       Cardiovascular negative cardio ROS Normal cardiovascular exam Rhythm:Regular Rate:Normal     Neuro/Psych negative neurological ROS  negative psych ROS   GI/Hepatic negative GI ROS, Neg liver ROS,   Endo/Other  negative endocrine ROS  Renal/GU negative Renal ROS  negative genitourinary   Musculoskeletal negative musculoskeletal ROS (+)   Abdominal   Peds negative pediatric ROS (+)  Hematology negative hematology ROS (+)   Anesthesia Other Findings   Reproductive/Obstetrics (+) Pregnancy                             Anesthesia Physical Anesthesia Plan  ASA: II  Anesthesia Plan: MAC   Post-op Pain Management:    Induction: Intravenous  Airway Management Planned: Mask  Additional Equipment:   Intra-op Plan:   Post-operative Plan:   Informed Consent: I have reviewed the patients History and Physical, chart, labs and discussed the procedure including the risks, benefits and alternatives for the proposed anesthesia with the patient or authorized representative who has indicated his/her understanding and acceptance.   Dental advisory given  Plan Discussed with:   Anesthesia Plan Comments:         Anesthesia Quick Evaluation

## 2015-03-07 NOTE — Addendum Note (Signed)
Addendum  created 03/07/15 1344 by Lauretta Grill, MD   Modules edited: Orders, PRL Based Order Sets

## 2015-03-07 NOTE — Transfer of Care (Signed)
Immediate Anesthesia Transfer of Care Note  Patient: Cathy [name]  Procedure(s) Performed: Procedure(s) with comments: DILATATION AND EVACUATION (N/A) - nonviable at 8 weeks O positive  Patient Location: PACU  Anesthesia Type:MAC  Level of Consciousness: awake, alert  and oriented  Airway & Oxygen Therapy: Patient Spontanous Breathing  Post-op Assessment: Report given to RN and Post -op Vital signs reviewed and stable  Post vital signs: Reviewed and stable  Last Vitals:  Filed Vitals:   03/07/15 1153  BP: 117/68  Pulse: 73  Temp: 36.8 C  Resp: 20    Complications: No apparent anesthesia complications

## 2015-03-08 ENCOUNTER — Encounter (HOSPITAL_COMMUNITY): Payer: Self-pay | Admitting: Obstetrics and Gynecology

## 2015-03-15 ENCOUNTER — Ambulatory Visit (INDEPENDENT_AMBULATORY_CARE_PROVIDER_SITE_OTHER): Payer: 59 | Admitting: Internal Medicine

## 2015-08-31 ENCOUNTER — Telehealth: Payer: Self-pay | Admitting: Genetic Counselor

## 2015-08-31 ENCOUNTER — Encounter: Payer: Self-pay | Admitting: Genetic Counselor

## 2015-08-31 NOTE — Telephone Encounter (Signed)
Verified address and insurance, faxed referring provider, mailed new pt calendar

## 2015-09-26 ENCOUNTER — Encounter: Payer: Self-pay | Admitting: Physician Assistant

## 2015-09-26 ENCOUNTER — Other Ambulatory Visit: Payer: Self-pay | Admitting: *Deleted

## 2015-09-26 ENCOUNTER — Ambulatory Visit (INDEPENDENT_AMBULATORY_CARE_PROVIDER_SITE_OTHER): Payer: 59 | Admitting: Physician Assistant

## 2015-09-26 ENCOUNTER — Other Ambulatory Visit: Payer: Self-pay | Admitting: Physician Assistant

## 2015-09-26 VITALS — BP 123/60 | HR 75 | Temp 98.8°F | Ht 63.0 in | Wt 148.4 lb

## 2015-09-26 DIAGNOSIS — J069 Acute upper respiratory infection, unspecified: Secondary | ICD-10-CM

## 2015-09-26 MED ORDER — DOXYCYCLINE HYCLATE 100 MG PO TABS: 100.0000 mg | ORAL_TABLET | Freq: Two times a day (BID) | ORAL | Status: DC

## 2015-09-26 NOTE — Progress Notes (Signed)
Subjective:     Patient ID: Cathy [name], female   DOB: 10/05/1975, 40 y.o.   MRN: [id number]  HPI Pt with cough/congestion x 1 week Using Flonase and Mucinex Sx worse with laying  Review of Systems  Constitutional: Positive for fatigue. Negative for fever, activity change and appetite change.  HENT: Positive for congestion, postnasal drip, rhinorrhea, sinus pressure, sore throat and voice change. Negative for ear discharge, ear pain, nosebleeds and sneezing.   Respiratory: Positive for cough. Negative for choking, shortness of breath and wheezing.   Cardiovascular: Negative.        Objective:   Physical Exam  Constitutional: She appears well-developed and well-nourished.  HENT:  Right Ear: External ear normal.  Left Ear: External ear normal.  Mouth/Throat: Oropharynx is clear and moist.  Neck: Neck supple.  Cardiovascular: Normal rate, regular rhythm and normal heart sounds.   No murmur heard. Pulmonary/Chest: Effort normal and breath sounds normal. No respiratory distress. She has no wheezes. She has no rales.  Lymphadenopathy:    She has cervical adenopathy.  Nursing note and vitals reviewed.      Assessment:     Upper respiratory infection      Plan:     Fluids Rest Doxycycline 100mg  bid x 10 days Claritin q am Benadryl qhs Continue Flonase and Mucinex F/U prn

## 2015-09-26 NOTE — Patient Instructions (Signed)

## 2015-10-06 ENCOUNTER — Encounter: Payer: 59 | Admitting: Genetic Counselor

## 2015-10-06 ENCOUNTER — Other Ambulatory Visit: Payer: 59

## 2015-11-07 ENCOUNTER — Telehealth: Payer: Self-pay | Admitting: Genetic Counselor

## 2015-11-07 NOTE — Telephone Encounter (Signed)
Lt mess regarding genetic counseling referral °

## 2016-05-03 ENCOUNTER — Encounter (HOSPITAL_BASED_OUTPATIENT_CLINIC_OR_DEPARTMENT_OTHER): Payer: Self-pay | Admitting: *Deleted

## 2016-05-03 NOTE — Progress Notes (Signed)
NPO AFTER MN.  ARRIVE AT 0730.  NEEDS CBC.  PER PT BLOOD TYPE , O+.

## 2016-05-04 ENCOUNTER — Ambulatory Visit (HOSPITAL_BASED_OUTPATIENT_CLINIC_OR_DEPARTMENT_OTHER)
Admission: RE | Admit: 2016-05-04 | Discharge: 2016-05-04 | Disposition: A | Discharge status: Home / Self Care | Payer: 59 | Source: Ambulatory Visit | Attending: Obstetrics and Gynecology | Admitting: Obstetrics and Gynecology

## 2016-05-04 ENCOUNTER — Encounter (HOSPITAL_BASED_OUTPATIENT_CLINIC_OR_DEPARTMENT_OTHER)
Admission: RE | Disposition: A | Discharge status: Home / Self Care | Payer: Self-pay | Source: Ambulatory Visit | Attending: Obstetrics and Gynecology

## 2016-05-04 ENCOUNTER — Ambulatory Visit (HOSPITAL_BASED_OUTPATIENT_CLINIC_OR_DEPARTMENT_OTHER): Payer: 59 | Admitting: Anesthesiology

## 2016-05-04 ENCOUNTER — Encounter (HOSPITAL_BASED_OUTPATIENT_CLINIC_OR_DEPARTMENT_OTHER): Payer: Self-pay | Admitting: Anesthesiology

## 2016-05-04 DIAGNOSIS — O021 Missed abortion: Secondary | ICD-10-CM | POA: Diagnosis not present

## 2016-05-04 HISTORY — DX: Missed abortion: O02.1

## 2016-05-04 HISTORY — PX: DILATION AND EVACUATION: SHX1459

## 2016-05-04 LAB — CBC
HEMATOCRIT: 37.6 % (ref 36.0–46.0)
HEMOGLOBIN: 13.3 g/dL (ref 12.0–15.0)
MCH: 32.9 pg (ref 26.0–34.0)
MCHC: 35.4 g/dL (ref 30.0–36.0)
MCV: 93.1 fL (ref 78.0–100.0)
Platelets: 190 10*3/uL (ref 150–400)
RBC: 4.04 MIL/uL (ref 3.87–5.11)
RDW: 12.7 % (ref 11.5–15.5)
WBC: 6.1 10*3/uL (ref 4.0–10.5)

## 2016-05-04 SURGERY — DILATION AND EVACUATION, UTERUS
Anesthesia: General | Site: Vagina

## 2016-05-04 MED ORDER — MIDAZOLAM HCL 5 MG/5ML IJ SOLN
INTRAMUSCULAR | Status: DC | PRN
  Administered 2016-05-04: 2 mg via INTRAVENOUS

## 2016-05-04 MED ORDER — KETOROLAC TROMETHAMINE 30 MG/ML IJ SOLN
INTRAMUSCULAR | Status: AC
  Filled 2016-05-04: qty 1

## 2016-05-04 MED ORDER — FENTANYL CITRATE (PF) 100 MCG/2ML IJ SOLN
INTRAMUSCULAR | Status: AC
  Filled 2016-05-04: qty 2

## 2016-05-04 MED ORDER — LACTATED RINGERS IV SOLN
INTRAVENOUS | Status: DC
  Administered 2016-05-04: 08:00:00 via INTRAVENOUS
  Filled 2016-05-04: qty 1000

## 2016-05-04 MED ORDER — CEFAZOLIN SODIUM-DEXTROSE 2-4 GM/100ML-% IV SOLN
INTRAVENOUS | Status: AC
  Filled 2016-05-04: qty 100

## 2016-05-04 MED ORDER — ONDANSETRON HCL 4 MG/2ML IJ SOLN
INTRAMUSCULAR | Status: DC | PRN
  Administered 2016-05-04: 4 mg via INTRAVENOUS

## 2016-05-04 MED ORDER — FENTANYL CITRATE (PF) 100 MCG/2ML IJ SOLN
INTRAMUSCULAR | Status: DC | PRN
  Administered 2016-05-04 (×4): 25 ug via INTRAVENOUS

## 2016-05-04 MED ORDER — KETOROLAC TROMETHAMINE 30 MG/ML IJ SOLN
INTRAMUSCULAR | Status: DC | PRN
  Administered 2016-05-04: 30 mg via INTRAVENOUS

## 2016-05-04 MED ORDER — MIDAZOLAM HCL 2 MG/2ML IJ SOLN
INTRAMUSCULAR | Status: AC
  Filled 2016-05-04: qty 2

## 2016-05-04 MED ORDER — DEXAMETHASONE SODIUM PHOSPHATE 4 MG/ML IJ SOLN
INTRAMUSCULAR | Status: DC | PRN
  Administered 2016-05-04: 10 mg via INTRAVENOUS

## 2016-05-04 MED ORDER — ONDANSETRON HCL 4 MG/2ML IJ SOLN
INTRAMUSCULAR | Status: AC
  Filled 2016-05-04: qty 2

## 2016-05-04 MED ORDER — LIDOCAINE HCL (CARDIAC) 20 MG/ML IV SOLN
INTRAVENOUS | Status: DC | PRN
  Administered 2016-05-04: 100 mg via INTRAVENOUS

## 2016-05-04 MED ORDER — OXYCODONE HCL 5 MG/5ML PO SOLN
5.0000 mg | Freq: Once | ORAL | Status: DC | PRN
  Filled 2016-05-04: qty 5

## 2016-05-04 MED ORDER — OXYCODONE-ACETAMINOPHEN 7.5-325 MG PO TABS: 1.0000 | ORAL_TABLET | ORAL | 0 refills | Status: DC | PRN

## 2016-05-04 MED ORDER — DEXAMETHASONE SODIUM PHOSPHATE 10 MG/ML IJ SOLN
INTRAMUSCULAR | Status: AC
  Filled 2016-05-04: qty 1

## 2016-05-04 MED ORDER — LIDOCAINE 2% (20 MG/ML) 5 ML SYRINGE
INTRAMUSCULAR | Status: AC
  Filled 2016-05-04: qty 5

## 2016-05-04 MED ORDER — PROPOFOL 10 MG/ML IV BOLUS
INTRAVENOUS | Status: DC | PRN
  Administered 2016-05-04: 200 mg via INTRAVENOUS

## 2016-05-04 MED ORDER — OXYCODONE HCL 5 MG PO TABS
5.0000 mg | ORAL_TABLET | Freq: Once | ORAL | Status: DC | PRN
  Filled 2016-05-04: qty 1

## 2016-05-04 MED ORDER — PROPOFOL 10 MG/ML IV BOLUS
INTRAVENOUS | Status: AC
  Filled 2016-05-04: qty 20

## 2016-05-04 MED ORDER — FENTANYL CITRATE (PF) 100 MCG/2ML IJ SOLN
25.0000 ug | INTRAMUSCULAR | Status: DC | PRN
  Filled 2016-05-04: qty 1

## 2016-05-04 MED ORDER — CHLOROPROCAINE HCL 1 % IJ SOLN
INTRAMUSCULAR | Status: DC | PRN
  Administered 2016-05-04: 20 mL

## 2016-05-04 MED ORDER — CEFAZOLIN SODIUM-DEXTROSE 2-4 GM/100ML-% IV SOLN
2.0000 g | INTRAVENOUS | Status: AC
  Administered 2016-05-04: 2 g via INTRAVENOUS
  Filled 2016-05-04: qty 100

## 2016-05-04 SURGICAL SUPPLY — 33 items
CATH ROBINSON RED A/P 16FR (CATHETERS) ×3 IMPLANT
COVER BACK TABLE 60X90IN (DRAPES) ×3 IMPLANT
DRAPE LG THREE QUARTER DISP (DRAPES) ×3 IMPLANT
DRAPE UNDERBUTTOCKS STRL (DRAPE) ×3 IMPLANT
FILTER UTR ASPR ASSEMBLY (MISCELLANEOUS) ×3 IMPLANT
GLOVE BIO SURGEON STRL SZ 6.5 (GLOVE) ×1 IMPLANT
GLOVE BIO SURGEON STRL SZ7 (GLOVE) ×6 IMPLANT
GLOVE BIO SURGEONS STRL SZ 6.5 (GLOVE) ×1
GLOVE BIOGEL PI IND STRL 6.5 (GLOVE) IMPLANT
GLOVE BIOGEL PI INDICATOR 6.5 (GLOVE) ×2
GOWN STRL REUS W/ TWL LRG LVL3 (GOWN DISPOSABLE) ×2 IMPLANT
GOWN STRL REUS W/TWL LRG LVL3 (GOWN DISPOSABLE) ×6
HOSE CONNECTING 18IN BERKELEY (TUBING) ×3 IMPLANT
KIT BERKELEY 1ST TRIMESTER 3/8 (MISCELLANEOUS) ×6 IMPLANT
KIT ROOM TURNOVER WOR (KITS) ×3 IMPLANT
LEGGING LITHOTOMY PAIR STRL (DRAPES) ×3 IMPLANT
NDL SPNL 18GX3.5 QUINCKE PK (NEEDLE) ×1 IMPLANT
NEEDLE SPNL 18GX3.5 QUINCKE PK (NEEDLE) ×3 IMPLANT
NS IRRIG 500ML POUR BTL (IV SOLUTION) ×3 IMPLANT
PACK BASIN DAY SURGERY FS (CUSTOM PROCEDURE TRAY) ×3 IMPLANT
PAD OB MATERNITY 4.3X12.25 (PERSONAL CARE ITEMS) ×3 IMPLANT
PAD PREP 24X48 CUFFED NSTRL (MISCELLANEOUS) ×3 IMPLANT
SET BERKELEY SUCTION TUBING (SUCTIONS) ×3 IMPLANT
SYR CONTROL 10ML LL (SYRINGE) ×3 IMPLANT
TOWEL OR 17X24 6PK STRL BLUE (TOWEL DISPOSABLE) ×6 IMPLANT
TRAY DSU PREP LF (CUSTOM PROCEDURE TRAY) ×3 IMPLANT
TUBE CONNECTING 12'X1/4 (SUCTIONS)
TUBE CONNECTING 12X1/4 (SUCTIONS) IMPLANT
UNDERPAD 30X30 INCONTINENT (UNDERPADS AND DIAPERS) ×3 IMPLANT
VACURETTE 10 RIGID CVD (CANNULA) IMPLANT
VACURETTE 7MM CVD STRL WRAP (CANNULA) IMPLANT
VACURETTE 8 RIGID CVD (CANNULA) ×2 IMPLANT
VACURETTE 9 RIGID CVD (CANNULA) IMPLANT

## 2016-05-04 NOTE — Discharge Instructions (Signed)

## 2016-05-04 NOTE — Anesthesia Preprocedure Evaluation (Signed)
Anesthesia Evaluation  Patient identified by MRN, date of birth, ID band Patient awake    Reviewed: Allergy & Precautions, NPO status , Patient's Chart, lab work & pertinent test results  History of Anesthesia Complications Negative for: history of anesthetic complications  Airway Mallampati: II  TM Distance: >3 FB Neck ROM: Full    Dental  (+) Teeth Intact   Pulmonary neg pulmonary ROS,    breath sounds clear to auscultation       Cardiovascular negative cardio ROS   Rhythm:Regular     Neuro/Psych negative neurological ROS  negative psych ROS   GI/Hepatic negative GI ROS, Neg liver ROS,   Endo/Other  negative endocrine ROS  Renal/GU negative Renal ROS     Musculoskeletal negative musculoskeletal ROS (+)   Abdominal   Peds  Hematology negative hematology ROS (+)   Anesthesia Other Findings   Reproductive/Obstetrics                             Anesthesia Physical Anesthesia Plan  ASA: I  Anesthesia Plan: General   Post-op Pain Management:    Induction: Intravenous  Airway Management Planned: LMA  Additional Equipment: None  Intra-op Plan:   Post-operative Plan: Extubation in OR  Informed Consent: I have reviewed the patients History and Physical, chart, labs and discussed the procedure including the risks, benefits and alternatives for the proposed anesthesia with the patient or authorized representative who has indicated his/her understanding and acceptance.   Dental advisory given  Plan Discussed with: CRNA and Surgeon  Anesthesia Plan Comments:        Anesthesia Quick Evaluation

## 2016-05-04 NOTE — Anesthesia Procedure Notes (Signed)
Procedure Name: LMA Insertion Date/Time: 05/04/2016 9:34 AM Performed by: Justice Rocher Pre-anesthesia Checklist: Patient identified, Emergency Drugs available, Suction available and Patient being monitored Patient Re-evaluated:Patient Re-evaluated prior to inductionOxygen Delivery Method: Circle system utilized Preoxygenation: Pre-oxygenation with 100% oxygen Intubation Type: IV induction Ventilation: Mask ventilation without difficulty LMA: LMA inserted LMA Size: 4.0 Number of attempts: 1 Airway Equipment and Method: Bite block Placement Confirmation: positive ETCO2 and breath sounds checked- equal and bilateral Tube secured with: Tape Dental Injury: Teeth and Oropharynx as per pre-operative assessment

## 2016-05-04 NOTE — Transfer of Care (Signed)
Immediate Anesthesia Transfer of Care Note  Patient: Cathy [name]  Procedure(s) Performed: Procedure(s) (LRB): DILATATION AND EVACUATION (N/A)  Patient Location: PACU  Anesthesia Type: General  Level of Consciousness: awake, sedated, patient cooperative and responds to stimulation  Airway & Oxygen Therapy: Patient Spontanous Breathing and Patient connected to [place] O2 Post-op Assessment: Report given to PACU RN, Post -op Vital signs reviewed and stable and Patient moving all extremities  Post vital signs: Reviewed and stable  Complications: No apparent anesthesia complications

## 2016-05-04 NOTE — H&P (Signed)
Cathy [name] is an 40 y.o. female. Presents for dilation and curettage for none viable first trimester pregnancy   Pertinent Gynecological History: Menses:regular   Bleeding: normal Contracept none DES exposure none Blood transfusions: none Sexually transmitted diseases: {std risk:32110none Previous GYN Proceduresd and e  Diagnostic lap Last mammogram na Last [id number] OB History: G4, P1   Menstrual History: Menarche age: *54 Patient's last menstrual period was 03/13/2016.    Past Medical History:  Diagnosis Date  . Missed ab   . Seasonal allergies     Past Surgical History:  Procedure Laterality Date  . CARPAL TUNNEL RELEASE  04/09/2012   Procedure: CARPAL TUNNEL RELEASE;  Surgeon: [name], MD;  Location: [facility];  Service: Orthopedics;  Laterality: Right;  . CESAREAN SECTION  12/17/2011   Procedure: CESAREAN SECTION; x1  Surgeon: [name], MD;  Location: [facility];  Service: Gynecology;  Laterality: N/A;  . CHOLECYSTECTOMY  11/06/2004  . DILATION AND EVACUATION N/A 08/17/2014   Procedure: DILATATION AND EVACUATION;  Surgeon: Arvella Nigh, MD;  Location: [facility];  Service: Gynecology;  Laterality: N/A;  . DILATION AND EVACUATION N/A 03/07/2015   Procedure: DILATATION AND EVACUATION;  Surgeon: Arvella Nigh, MD;  Location: [facility];  Service: Gynecology;  Laterality: N/A;  nonviable at 8 weeks O positive  . DORSAL COMPARTMENT RELEASE  04/09/2012   Procedure: RELEASE DORSAL COMPARTMENT (DEQUERVAIN);  Surgeon: [name], MD;  Location: [facility];  Service: Orthopedics;  Laterality: Right;  RELEASE 1ST DORSAL RIGHT  . LAPAROSCOPIC ENDOMETRIOSIS FULGURATION  1996    Family History  Problem Relation Age of Onset  . Colon cancer Mother 80  . Heart failure Father   . Hypertension Father   . Pancreatic cancer Maternal Grandmother   . Colon cancer Paternal Grandmother   . Prostate cancer Maternal Grandfather   . Pancreatitis Neg Hx      Social History:  reports that she has never smoked. She has never used smokeless tobacco. She reports that she drinks alcohol. She reports that she does not use drugs.  Allergies:  Allergies  Allergen Reactions  . Sudafed [Pseudoephedrine] Other (See Comments)    Makes pt delirious   . Adhesive [Tape] Rash  . Other Other (See Comments)    PERFUMED SOAPS/LOTIONS - IS BOTHERED BY FRAGRANCES Gives pt headaches  . Sulfa Antibiotics Nausea And Vomiting    No prescriptions prior to admission.    Review of Systems  All other systems reviewed and are negative.   Height 5\' 3"  (1.6 m), weight 155 lb (70.3 kg), last menstrual period 03/13/2016. Physical Exam  Constitutional: She is oriented to person, place, and time. She appears well-developed and well-nourished.  Neck: Normal range of motion. Neck supple.  Cardiovascular: Normal rate, regular rhythm and normal heart sounds.   Respiratory: Effort normal and breath sounds normal.  GI: Soft. Bowel sounds are normal.  Genitourinary:  Genitourinary Comments: Minimal bleeding uterus 9 week  Musculoskeletal: Normal range of motion.  Neurological: She is alert and oriented to person, place, and time.    No results found for this or any previous visit (from the past 24 hour(s)).  No results found.  Assessment/Plan: Nonviable first trimester pregnanc Plan dilation and curettage.  Risk discussed.  hemmorhage with need for transfusions.  Perforation with injury to adjacent organs requiring exploratory surgery. Some injuries could require hysterectomy.  Risk of dvt and pe.  Nastasha Reising S 05/04/2016, 7:03 AM

## 2016-05-04 NOTE — Brief Op Note (Signed)
05/04/2016  9:47 AM  PATIENT:  Cathy [name]  40 y.o. female  PRE-OPERATIVE DIAGNOSIS:  MISSED AB  POST-OPERATIVE DIAGNOSIS:  MISSED AB  PROCEDURE:  Procedure(s): DILATATION AND EVACUATION (N/A)  SURGEON:  Surgeon(s) and Role:    * Arvella Nigh, MD - Primary  PHYSICIAN ASSISTANT:   ASSISTANTS: none   ANESTHESIA:   general and paracervical block  EBL:  Total I/O In: 200 [I.V.:200] Out: 100 [Blood:100]  BLOOD ADMINISTERED:none  DRAINS: none   LOCAL MEDICATIONS USED:  OTHER nesicaine  SPECIMEN:  Source of Specimen:  products of conception  DISPOSITION OF SPECIMEN:  PATHOLOGY  COUNTS:  YES  TOURNIQUET:  * No tourniquets in log *  DICTATION: .Other Dictation: Dictation Number [id number]  PLAN OF CARE: Discharge to home after PACU  PATIENT DISPOSITION:  PACU - hemodynamically stable.   Delay start of Pharmacological VTE agent (>24hrs) due to surgical blood loss or risk of bleeding: not applicable

## 2016-05-05 NOTE — Op Note (Signed)
NAME:  Cathy [name], Cathy [name] NO.:  [id number]  MEDICAL RECORD NO.:  [id number]  LOCATION:                                 FACILITY:  PHYSICIAN:  [name], M.D.        DATE OF BIRTH:  DATE OF PROCEDURE:  05/04/2016 DATE OF DISCHARGE:                              OPERATIVE REPORT   PREOPERATIVE DIAGNOSIS:  Nonviable first trimester pregnancy.  POSTOPERATIVE DIAGNOSIS:  Nonviable first trimester pregnancy.  OPERATIVE PROCEDURE:  Dilation and evacuation along with a paracervical block.  SURGEON:  [name], M.D.  ANESTHESIA:  Paracervical block with general anesthesia.  ESTIMATED BLOOD LOSS:  100 mL.  PACKS AND DRAINS:  None.  INTRAOPERATIVE BLOOD PLACED:  None.  COMPLICATIONS:  None.  INDICATION:  Dictated in history and physical.  PROCEDURE IN DETAIL:  The patient was taken to the OR, placed in supine position.  After satisfactory level of general anesthesia obtained, the patient was placed in dorsal lithotomy position using Allen stirrups. Perineum and vagina were prepped out with Betadine and draped as a sterile field.  Speculum was placed in vaginal vault.  Cervix was grasped with single-tooth tenaculum.  Paracervical block of 1% Nesacaine ensued.  Uterus sounded to approximately 9 cm.  Cervix was dilated to a size 23 Pratt dilator.  A size 8 curved suction curette was introduced and intrauterine cavity was emptied using suction curetting.  No tissue was obtained.  Sharp curetting revealed all quadrants to be clear. Repeat suction curetting revealed no additional tissue and repeat sharp curetting, no additional tissue, uterus contracting down well and minimal bleeding was noted.  At this point in time, the speculum and single-tooth tenaculum were removed.  The patient was taken out of the dorsal lithotomy position.  Once alert, extubated and transferred to recovery room in good condition.  Sponge, instrument, and needle counts reported as  correct by circulating nurse.  Blood type is O positive. RhoGAM will not be required.     [name], M.D.     JSM/MEDQ  D:  05/04/2016  T:  05/04/2016  Job:  [id number]

## 2016-05-05 NOTE — Anesthesia Postprocedure Evaluation (Signed)
Anesthesia Post Note  Patient: KARRA ASCHOFF  Procedure(s) Performed: Procedure(s) (LRB): DILATATION AND EVACUATION (N/A)  Patient location during evaluation: PACU Anesthesia Type: General Level of consciousness: awake and alert Pain management: pain level controlled Vital Signs Assessment: post-procedure vital signs reviewed and stable Respiratory status: spontaneous breathing, nonlabored ventilation, respiratory function stable and patient connected to nasal cannula oxygen Cardiovascular status: blood pressure returned to baseline and stable Postop Assessment: no signs of nausea or vomiting Anesthetic complications: no       Last Vitals:  Vitals:   05/04/16 1015 05/04/16 1109  BP: (!) 103/47 (!) 104/57  Pulse: 65   Resp: 12 16  Temp:  36.8 C    Last Pain:  Vitals:   05/04/16 1109  TempSrc: Tympanic  PainSc:                  Nyla Creason

## 2016-05-08 ENCOUNTER — Encounter (HOSPITAL_BASED_OUTPATIENT_CLINIC_OR_DEPARTMENT_OTHER): Payer: Self-pay | Admitting: Obstetrics and Gynecology

## 2016-06-14 HISTORY — PX: OTHER SURGICAL HISTORY: SHX169

## 2016-09-20 ENCOUNTER — Ambulatory Visit (INDEPENDENT_AMBULATORY_CARE_PROVIDER_SITE_OTHER): Payer: 59 | Admitting: Otolaryngology

## 2016-09-20 DIAGNOSIS — J342 Deviated nasal septum: Secondary | ICD-10-CM

## 2016-09-20 DIAGNOSIS — J343 Hypertrophy of nasal turbinates: Secondary | ICD-10-CM | POA: Diagnosis not present

## 2016-09-20 DIAGNOSIS — R070 Pain in throat: Secondary | ICD-10-CM | POA: Diagnosis not present

## 2016-10-29 IMAGING — US US OB COMP LESS 14 WK
1 series · 15 of 28 positions shown · non-contrast
Comparison: None available

CLINICAL DATA: Verify fetal demise. D and C is scheduled for [DATE]
a.m. today. By LMP patient is 8 weeks 6 days. EDC by LMP 10/11/2015.

EXAM:
OBSTETRIC <14 WK US AND TRANSVAGINAL OB US
TECHNIQUE: Both transabdominal and transvaginal ultrasound examinations were
performed for complete evaluation of the gestation as well as the
maternal uterus, adnexal regions, and pelvic cul-de-sac.
Transvaginal technique was performed to assess early pregnancy.

[Series 1: us ob comp less 14 wk · 57 acquisitions, 15 frames shown]
[im 1/57]
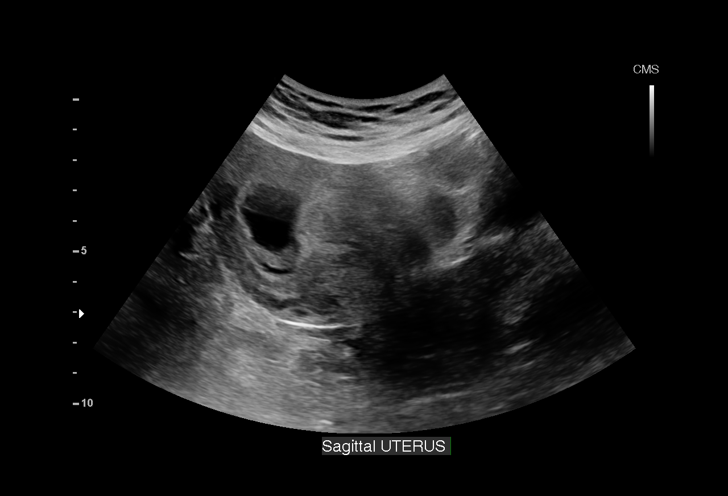
[im 5/57]
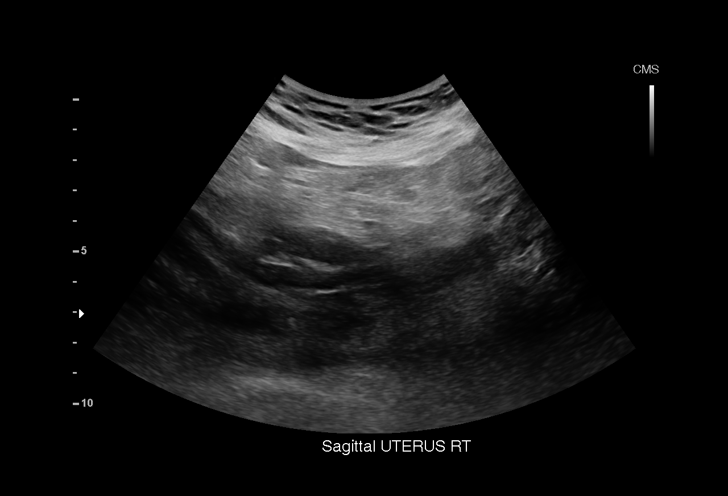
[im 9/57]
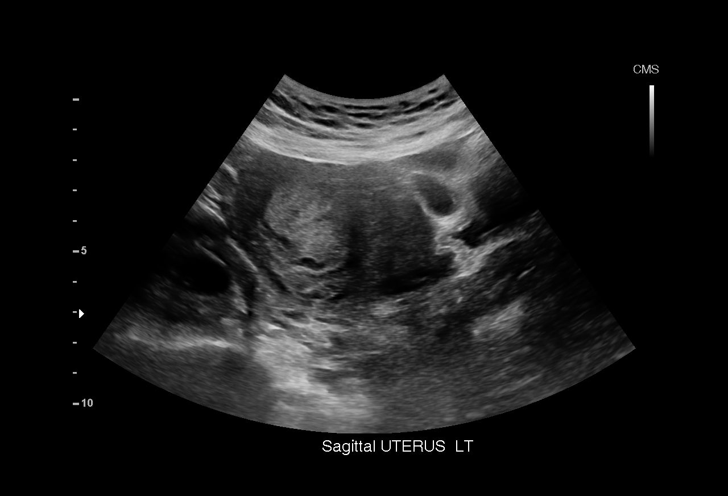
[im 13/57]
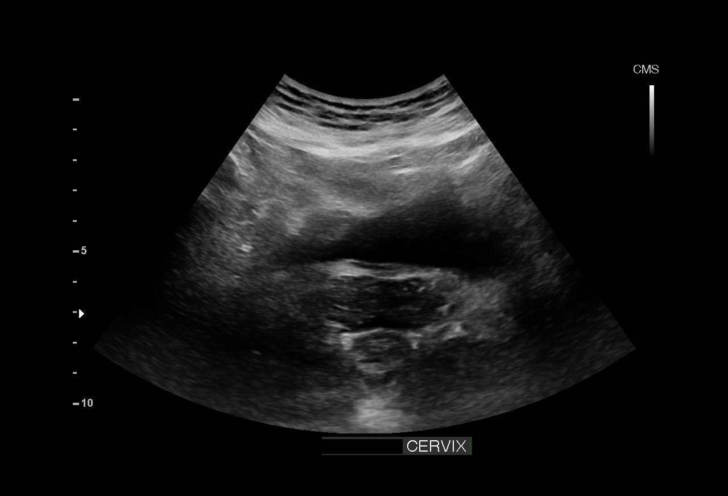
[im 17/57]
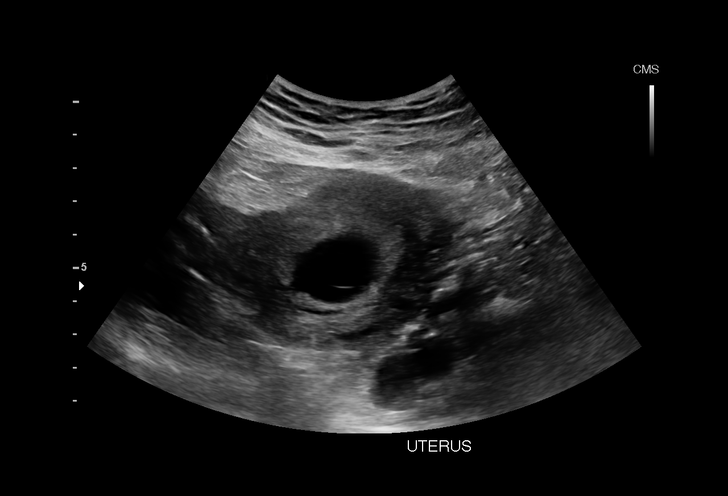
[im 21/57]
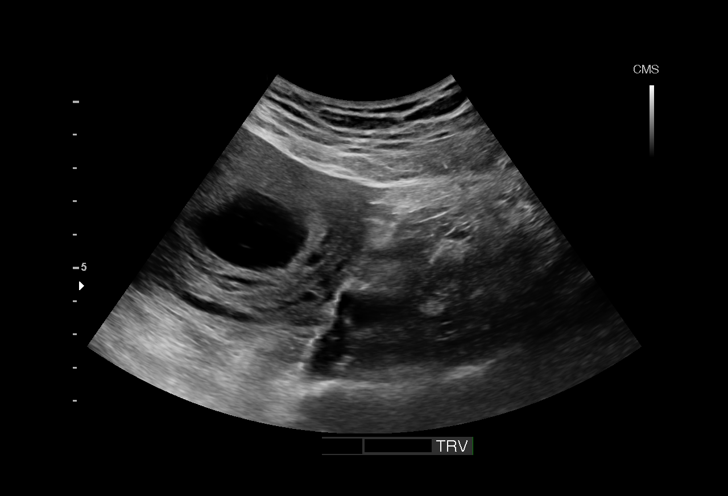
[im 25/57]
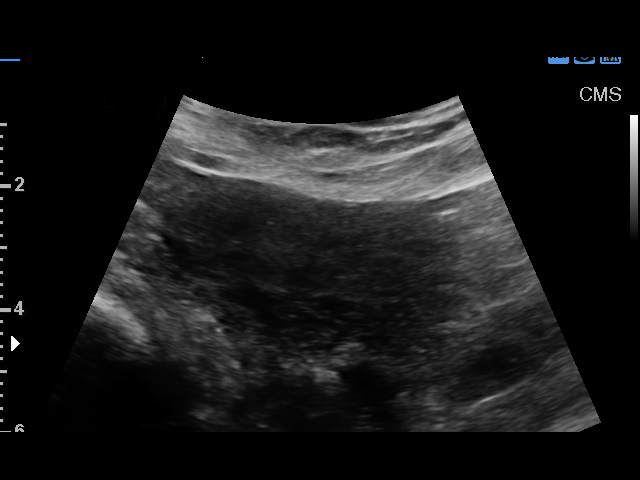
[im 30/57]
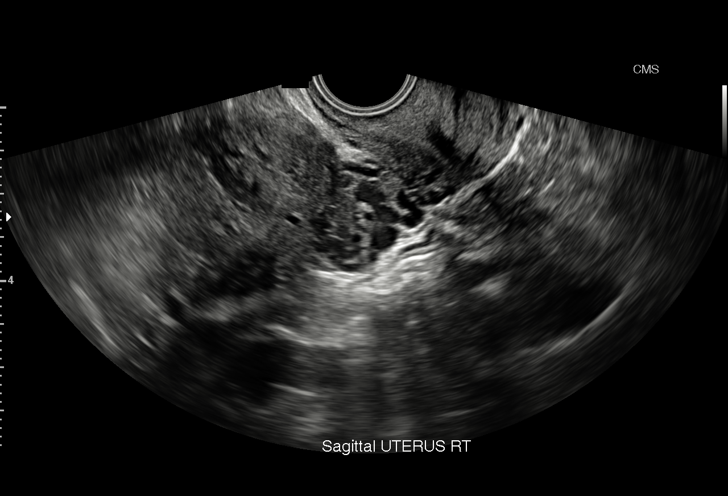
[im 32/57]
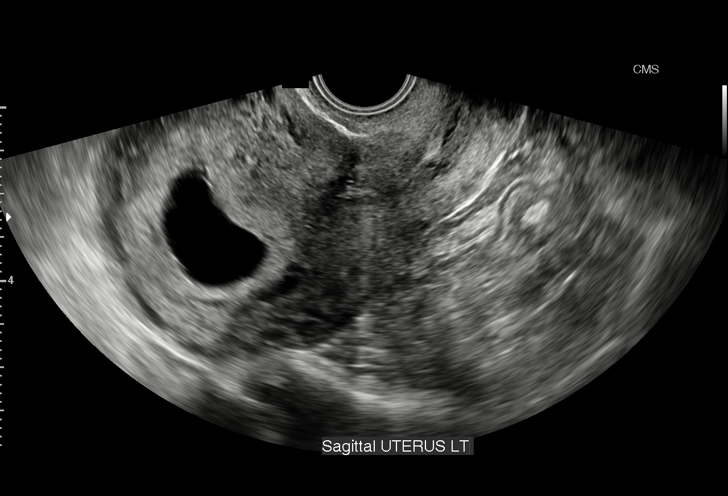
[im 36/57]
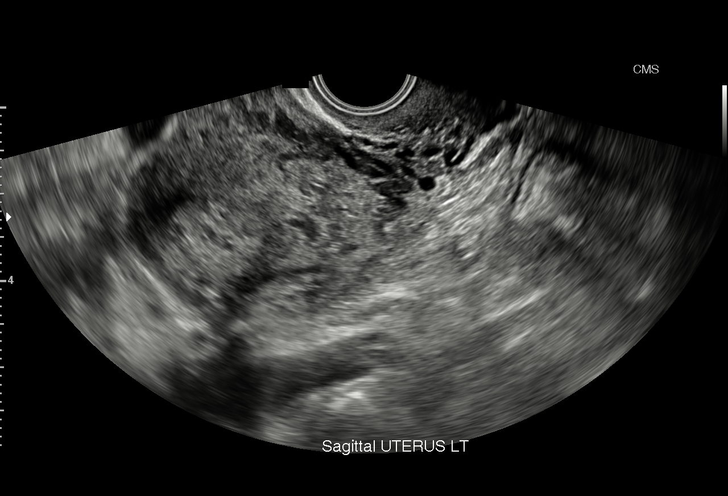
[im 40/57]
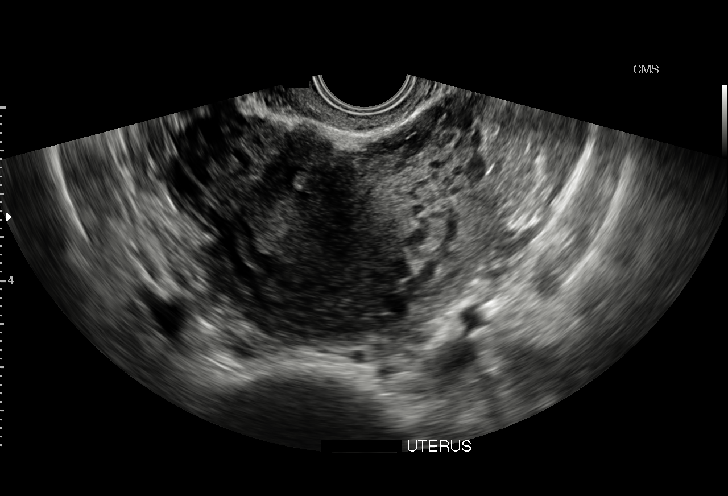
[im 44/57]
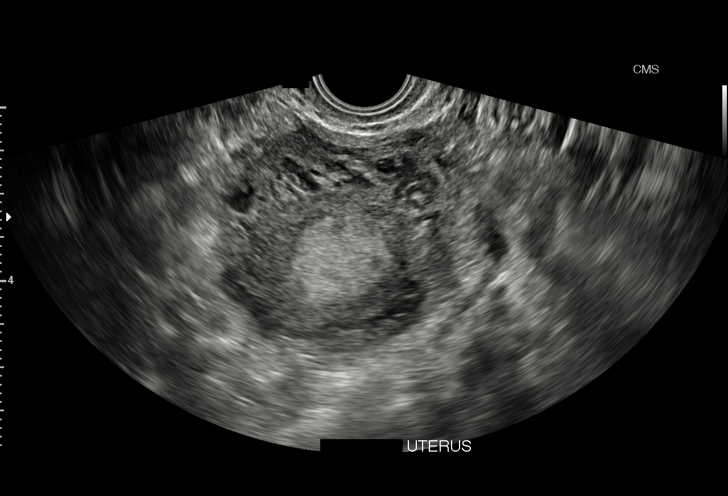
[im 48/57]
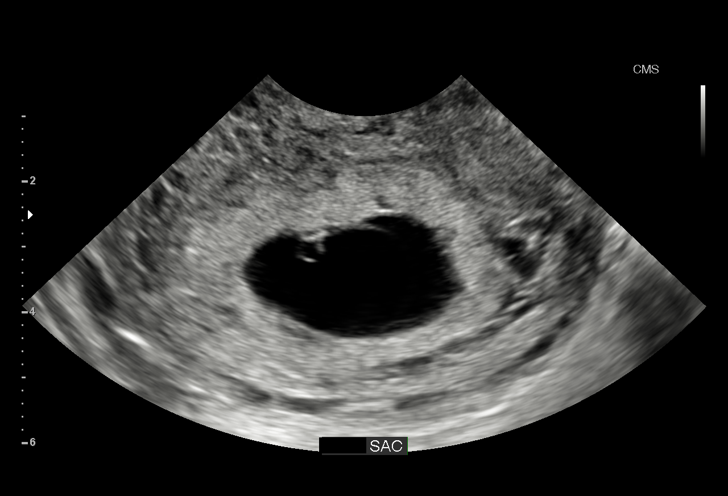
[im 52/57]
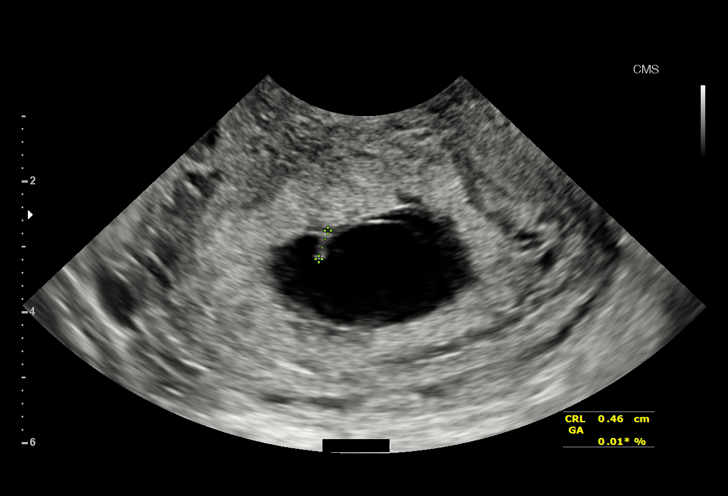
[im 57/57]
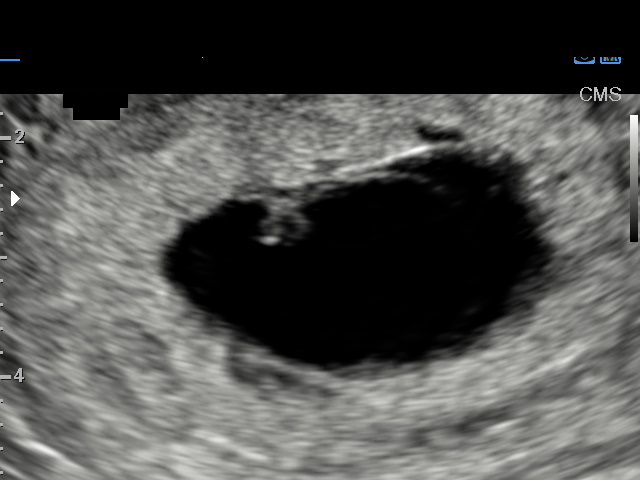

[15 of 28 positions shown; findings below may reference images not displayed]

FINDINGS: Intrauterine gestational sac: Present

Yolk sac:  Present

Embryo:  Present

Cardiac Activity: Not seen

Heart Rate: Absent

CRL:  4.3  mm   6 w   1 d                  US EDC: 10/22/2015

Maternal uterus/adnexae: No subchorionic hemorrhage identified. The
ovaries are not visualized.
IMPRESSION: 1. Intrauterine embryo measuring 6 weeks 1 day.
2. No fetal cardiac activity identified. At this time, on a single
ultrasound exam, criteria have not been met for definitive
documentation of a failed pregnancy. Correlation with prior studies
are therefore needed.

## 2016-11-29 ENCOUNTER — Ambulatory Visit (INDEPENDENT_AMBULATORY_CARE_PROVIDER_SITE_OTHER): Payer: 59 | Admitting: Otolaryngology

## 2017-01-07 ENCOUNTER — Ambulatory Visit (INDEPENDENT_AMBULATORY_CARE_PROVIDER_SITE_OTHER): Payer: 59 | Admitting: Otolaryngology

## 2017-01-07 DIAGNOSIS — H9202 Otalgia, left ear: Secondary | ICD-10-CM

## 2017-01-07 DIAGNOSIS — J310 Chronic rhinitis: Secondary | ICD-10-CM

## 2017-01-07 DIAGNOSIS — R070 Pain in throat: Secondary | ICD-10-CM

## 2017-01-07 DIAGNOSIS — J343 Hypertrophy of nasal turbinates: Secondary | ICD-10-CM | POA: Diagnosis not present

## 2017-09-05 ENCOUNTER — Other Ambulatory Visit (INDEPENDENT_AMBULATORY_CARE_PROVIDER_SITE_OTHER): Payer: Self-pay | Admitting: Otolaryngology

## 2017-09-05 ENCOUNTER — Ambulatory Visit (INDEPENDENT_AMBULATORY_CARE_PROVIDER_SITE_OTHER): Payer: 59 | Admitting: Otolaryngology

## 2017-09-05 DIAGNOSIS — J310 Chronic rhinitis: Secondary | ICD-10-CM | POA: Diagnosis not present

## 2017-09-05 DIAGNOSIS — J343 Hypertrophy of nasal turbinates: Secondary | ICD-10-CM

## 2017-09-05 DIAGNOSIS — J320 Chronic maxillary sinusitis: Secondary | ICD-10-CM

## 2017-09-05 DIAGNOSIS — J342 Deviated nasal septum: Secondary | ICD-10-CM

## 2017-09-30 ENCOUNTER — Ambulatory Visit (INDEPENDENT_AMBULATORY_CARE_PROVIDER_SITE_OTHER): Payer: 59 | Admitting: Otolaryngology

## 2017-09-30 DIAGNOSIS — J342 Deviated nasal septum: Secondary | ICD-10-CM

## 2017-09-30 DIAGNOSIS — J310 Chronic rhinitis: Secondary | ICD-10-CM

## 2017-09-30 DIAGNOSIS — J343 Hypertrophy of nasal turbinates: Secondary | ICD-10-CM | POA: Diagnosis not present

## 2017-10-08 ENCOUNTER — Ambulatory Visit (HOSPITAL_COMMUNITY)
Admission: RE | Admit: 2017-10-08 | Discharge: 2017-10-08 | Disposition: A | Discharge status: Home / Self Care | Payer: 59 | Source: Ambulatory Visit | Attending: Otolaryngology | Admitting: Otolaryngology

## 2017-10-08 DIAGNOSIS — Z0389 Encounter for observation for other suspected diseases and conditions ruled out: Secondary | ICD-10-CM | POA: Diagnosis not present

## 2017-10-08 DIAGNOSIS — J320 Chronic maxillary sinusitis: Secondary | ICD-10-CM

## 2017-10-17 ENCOUNTER — Ambulatory Visit (INDEPENDENT_AMBULATORY_CARE_PROVIDER_SITE_OTHER): Payer: 59 | Admitting: Otolaryngology

## 2017-10-17 DIAGNOSIS — J310 Chronic rhinitis: Secondary | ICD-10-CM | POA: Diagnosis not present

## 2017-10-17 DIAGNOSIS — J343 Hypertrophy of nasal turbinates: Secondary | ICD-10-CM

## 2017-10-17 DIAGNOSIS — J342 Deviated nasal septum: Secondary | ICD-10-CM

## 2017-10-30 ENCOUNTER — Other Ambulatory Visit: Payer: Self-pay | Admitting: Otolaryngology

## 2017-12-16 ENCOUNTER — Encounter (HOSPITAL_BASED_OUTPATIENT_CLINIC_OR_DEPARTMENT_OTHER): Payer: Self-pay | Admitting: *Deleted

## 2017-12-16 ENCOUNTER — Other Ambulatory Visit: Payer: Self-pay

## 2017-12-24 ENCOUNTER — Encounter (HOSPITAL_BASED_OUTPATIENT_CLINIC_OR_DEPARTMENT_OTHER)
Admission: RE | Disposition: A | Discharge status: Home / Self Care | Payer: Self-pay | Source: Ambulatory Visit | Attending: Otolaryngology

## 2017-12-24 ENCOUNTER — Ambulatory Visit (HOSPITAL_BASED_OUTPATIENT_CLINIC_OR_DEPARTMENT_OTHER)
Admission: RE | Admit: 2017-12-24 | Discharge: 2017-12-24 | Disposition: A | Discharge status: Home / Self Care | Payer: 59 | Source: Ambulatory Visit | Attending: Otolaryngology | Admitting: Otolaryngology

## 2017-12-24 ENCOUNTER — Encounter (HOSPITAL_BASED_OUTPATIENT_CLINIC_OR_DEPARTMENT_OTHER): Payer: Self-pay | Admitting: Anesthesiology

## 2017-12-24 ENCOUNTER — Ambulatory Visit (HOSPITAL_BASED_OUTPATIENT_CLINIC_OR_DEPARTMENT_OTHER): Payer: 59 | Admitting: Anesthesiology

## 2017-12-24 ENCOUNTER — Other Ambulatory Visit: Payer: Self-pay

## 2017-12-24 DIAGNOSIS — J310 Chronic rhinitis: Secondary | ICD-10-CM | POA: Diagnosis not present

## 2017-12-24 DIAGNOSIS — K449 Diaphragmatic hernia without obstruction or gangrene: Secondary | ICD-10-CM | POA: Insufficient documentation

## 2017-12-24 DIAGNOSIS — E669 Obesity, unspecified: Secondary | ICD-10-CM | POA: Insufficient documentation

## 2017-12-24 DIAGNOSIS — Z6828 Body mass index (BMI) 28.0-28.9, adult: Secondary | ICD-10-CM | POA: Diagnosis not present

## 2017-12-24 DIAGNOSIS — J3489 Other specified disorders of nose and nasal sinuses: Secondary | ICD-10-CM | POA: Insufficient documentation

## 2017-12-24 DIAGNOSIS — J343 Hypertrophy of nasal turbinates: Secondary | ICD-10-CM | POA: Insufficient documentation

## 2017-12-24 DIAGNOSIS — J342 Deviated nasal septum: Secondary | ICD-10-CM | POA: Diagnosis not present

## 2017-12-24 HISTORY — PX: NASAL SEPTOPLASTY W/ TURBINOPLASTY: SHX2070

## 2017-12-24 SURGERY — SEPTOPLASTY, NOSE, WITH NASAL TURBINATE REDUCTION
Anesthesia: General | Site: Nose | Laterality: Bilateral

## 2017-12-24 MED ORDER — SUCCINYLCHOLINE CHLORIDE 20 MG/ML IJ SOLN
INTRAMUSCULAR | Status: DC | PRN
Start: 2017-12-24 — End: 2017-12-24
  Administered 2017-12-24: 100 mg via INTRAVENOUS

## 2017-12-24 MED ORDER — HYDROCODONE-ACETAMINOPHEN 7.5-325 MG PO TABS: 1.0000 | ORAL_TABLET | Freq: Once | ORAL | Status: DC | PRN

## 2017-12-24 MED ORDER — SCOPOLAMINE 1 MG/3DAYS TD PT72
1.0000 | MEDICATED_PATCH | Freq: Once | TRANSDERMAL | Status: DC | PRN
  Administered 2017-12-24: 1.5 mg via TRANSDERMAL

## 2017-12-24 MED ORDER — FENTANYL CITRATE (PF) 100 MCG/2ML IJ SOLN
INTRAMUSCULAR | Status: AC
  Filled 2017-12-24: qty 2

## 2017-12-24 MED ORDER — SUCCINYLCHOLINE CHLORIDE 200 MG/10ML IV SOSY
PREFILLED_SYRINGE | INTRAVENOUS | Status: AC
  Filled 2017-12-24: qty 10

## 2017-12-24 MED ORDER — ONDANSETRON HCL 4 MG/2ML IJ SOLN
INTRAMUSCULAR | Status: AC
  Filled 2017-12-24: qty 2

## 2017-12-24 MED ORDER — MIDAZOLAM HCL 2 MG/2ML IJ SOLN
INTRAMUSCULAR | Status: AC
  Filled 2017-12-24: qty 2

## 2017-12-24 MED ORDER — DEXAMETHASONE SODIUM PHOSPHATE 10 MG/ML IJ SOLN
INTRAMUSCULAR | Status: AC
  Filled 2017-12-24: qty 1

## 2017-12-24 MED ORDER — DEXAMETHASONE SODIUM PHOSPHATE 4 MG/ML IJ SOLN
INTRAMUSCULAR | Status: DC | PRN
Start: 2017-12-24 — End: 2017-12-24
  Administered 2017-12-24: 10 mg via INTRAVENOUS

## 2017-12-24 MED ORDER — OXYCODONE-ACETAMINOPHEN 5-325 MG PO TABS: 1.0000 | ORAL_TABLET | ORAL | 0 refills | Status: DC | PRN

## 2017-12-24 MED ORDER — CEFAZOLIN SODIUM-DEXTROSE 2-3 GM-%(50ML) IV SOLR
INTRAVENOUS | Status: DC | PRN
  Administered 2017-12-24: 2 g via INTRAVENOUS

## 2017-12-24 MED ORDER — MEPERIDINE HCL 25 MG/ML IJ SOLN: 6.2500 mg | INTRAMUSCULAR | Status: DC | PRN

## 2017-12-24 MED ORDER — OXYMETAZOLINE HCL 0.05 % NA SOLN
NASAL | Status: DC | PRN
  Administered 2017-12-24: 1 via TOPICAL

## 2017-12-24 MED ORDER — LIDOCAINE-EPINEPHRINE 1 %-1:100000 IJ SOLN
INTRAMUSCULAR | Status: DC | PRN
  Administered 2017-12-24: 1.5 mL

## 2017-12-24 MED ORDER — PROPOFOL 10 MG/ML IV BOLUS
INTRAVENOUS | Status: DC | PRN
  Administered 2017-12-24: 150 mg via INTRAVENOUS

## 2017-12-24 MED ORDER — MIDAZOLAM HCL 2 MG/2ML IJ SOLN
1.0000 mg | INTRAMUSCULAR | Status: DC | PRN
  Administered 2017-12-24: 2 mg via INTRAVENOUS

## 2017-12-24 MED ORDER — FENTANYL CITRATE (PF) 100 MCG/2ML IJ SOLN
25.0000 ug | INTRAMUSCULAR | Status: DC | PRN
  Administered 2017-12-24: 25 ug via INTRAVENOUS

## 2017-12-24 MED ORDER — ONDANSETRON HCL 4 MG/2ML IJ SOLN: 4.0000 mg | Freq: Once | INTRAMUSCULAR | Status: DC | PRN

## 2017-12-24 MED ORDER — AMOXICILLIN 875 MG PO TABS: 875.0000 mg | ORAL_TABLET | Freq: Two times a day (BID) | ORAL | 0 refills | Status: AC

## 2017-12-24 MED ORDER — MUPIROCIN 2 % EX OINT
TOPICAL_OINTMENT | CUTANEOUS | Status: DC | PRN
  Administered 2017-12-24: 1 via NASAL

## 2017-12-24 MED ORDER — FENTANYL CITRATE (PF) 100 MCG/2ML IJ SOLN
50.0000 ug | INTRAMUSCULAR | Status: AC | PRN
  Administered 2017-12-24: 50 ug via INTRAVENOUS
  Administered 2017-12-24: 25 ug via INTRAVENOUS
  Administered 2017-12-24: 50 ug via INTRAVENOUS
  Administered 2017-12-24: 25 ug via INTRAVENOUS

## 2017-12-24 MED ORDER — LACTATED RINGERS IV SOLN
INTRAVENOUS | Status: DC
  Administered 2017-12-24 (×2): via INTRAVENOUS

## 2017-12-24 MED ORDER — CEFAZOLIN SODIUM 1 G IJ SOLR
INTRAMUSCULAR | Status: AC
  Filled 2017-12-24: qty 20

## 2017-12-24 MED ORDER — SCOPOLAMINE 1 MG/3DAYS TD PT72
MEDICATED_PATCH | TRANSDERMAL | Status: AC
  Filled 2017-12-24: qty 1

## 2017-12-24 MED ORDER — LIDOCAINE 2% (20 MG/ML) 5 ML SYRINGE
INTRAMUSCULAR | Status: AC
  Filled 2017-12-24: qty 5

## 2017-12-24 MED ORDER — ONDANSETRON HCL 4 MG/2ML IJ SOLN
INTRAMUSCULAR | Status: DC | PRN
  Administered 2017-12-24: 4 mg via INTRAVENOUS

## 2017-12-24 MED ORDER — LIDOCAINE 2% (20 MG/ML) 5 ML SYRINGE
INTRAMUSCULAR | Status: DC | PRN
  Administered 2017-12-24: 60 mg via INTRAVENOUS

## 2017-12-24 SURGICAL SUPPLY — 34 items
ATTRACTOMAT 16X20 MAGNETIC DRP (DRAPES) IMPLANT
CANISTER SUCT 1200ML W/VALVE (MISCELLANEOUS) ×3 IMPLANT
COAGULATOR SUCT 8FR VV (MISCELLANEOUS) ×3 IMPLANT
DECANTER SPIKE VIAL GLASS SM (MISCELLANEOUS) IMPLANT
DRSG NASOPORE 8CM (GAUZE/BANDAGES/DRESSINGS) IMPLANT
DRSG TELFA 3X8 NADH (GAUZE/BANDAGES/DRESSINGS) IMPLANT
ELECT REM PT RETURN 9FT ADLT (ELECTROSURGICAL) ×3
ELECTRODE REM PT RTRN 9FT ADLT (ELECTROSURGICAL) ×1 IMPLANT
GLOVE BIO SURGEON STRL SZ7.5 (GLOVE) ×3 IMPLANT
GLOVE BIOGEL PI IND STRL 7.5 (GLOVE) IMPLANT
GLOVE BIOGEL PI INDICATOR 7.5 (GLOVE) ×2
GOWN STRL REUS W/ TWL LRG LVL3 (GOWN DISPOSABLE) ×2 IMPLANT
GOWN STRL REUS W/TWL LRG LVL3 (GOWN DISPOSABLE) ×6
NDL HYPO 25X1 1.5 SAFETY (NEEDLE) ×1 IMPLANT
NEEDLE HYPO 25X1 1.5 SAFETY (NEEDLE) ×3 IMPLANT
NS IRRIG 1000ML POUR BTL (IV SOLUTION) ×3 IMPLANT
PACK BASIN DAY SURGERY FS (CUSTOM PROCEDURE TRAY) ×3 IMPLANT
PACK ENT DAY SURGERY (CUSTOM PROCEDURE TRAY) ×3 IMPLANT
PAD DRESSING TELFA 3X8 NADH (GAUZE/BANDAGES/DRESSINGS) IMPLANT
SLEEVE SCD COMPRESS KNEE MED (MISCELLANEOUS) ×2 IMPLANT
SOLUTION BUTLER CLEAR DIP (MISCELLANEOUS) ×3 IMPLANT
SPLINT NASAL AIRWAY SILICONE (MISCELLANEOUS) ×2 IMPLANT
SPONGE GAUZE 2X2 8PLY STER LF (GAUZE/BANDAGES/DRESSINGS) ×1
SPONGE GAUZE 2X2 8PLY STRL LF (GAUZE/BANDAGES/DRESSINGS) ×2 IMPLANT
SPONGE NEURO XRAY DETECT 1X3 (DISPOSABLE) ×3 IMPLANT
SUT CHROMIC 4 0 P 3 18 (SUTURE) ×3 IMPLANT
SUT PLAIN 4 0 ~~LOC~~ 1 (SUTURE) ×3 IMPLANT
SUT PROLENE 3 0 PS 2 (SUTURE) ×3 IMPLANT
SUT VIC AB 4-0 P-3 18XBRD (SUTURE) IMPLANT
SUT VIC AB 4-0 P3 18 (SUTURE)
TOWEL GREEN STERILE FF (TOWEL DISPOSABLE) ×3 IMPLANT
TUBE SALEM SUMP 12R W/ARV (TUBING) IMPLANT
TUBE SALEM SUMP 16 FR W/ARV (TUBING) ×3 IMPLANT
YANKAUER SUCT BULB TIP NO VENT (SUCTIONS) ×3 IMPLANT

## 2017-12-24 NOTE — Discharge Instructions (Addendum)
POSTOPERATIVE INSTRUCTIONS FOR PATIENTS HAVING NASAL OR SINUS OPERATIONS ACTIVITY: Restrict activity at home for the first two days, resting as much as possible. Light activity is best. You may usually return to work within a week. You should refrain from nose blowing, strenuous activity, or heavy lifting greater than 20lbs for a total of three weeks after your operation.  If sneezing cannot be avoided, sneeze with your mouth open. DISCOMFORT: You may experience a dull headache and pressure along with nasal congestion and discharge. These symptoms may be worse during the first week after the operation but may last as long as two to four weeks.  Please take Tylenol or the pain medication that has been prescribed for you. Do not take aspirin or aspirin containing medications since they may cause bleeding.  You may experience symptoms of post nasal drainage, nasal congestion, headaches and fatigue for two or three months after your operation.  BLEEDING: You may have some blood tinged nasal drainage for approximately two weeks after the operation.  The discharge will be worse for the first week.  Please call our office at [phone] or go to the nearest hospital emergency room if you experience any of the following: heavy, bright red blood from your nose or mouth that lasts longer than ten minutes or coughing up or vomiting bright red blood or blood clots. GENERAL CONSIDERATIONS: 1. A gauze dressing will be placed on your upper lip to absorb any drainage after the operation. You may need to change this several times a day.  If you do not have very much drainage, you may remove the dressing.  Remember that you may gently wipe your nose with a tissue and sniff in, but DO NOT blow your nose. 2. Please keep all of your postoperative appointments.  Your final results after the operation will depend on proper follow-up.  The initial visit is usually four to seven days after the operation.  During this visit, the  remaining nasal packing and internal septal splints will be removed.  Your nasal and sinus cavities will be cleaned.  During the second visit, your nasal and sinus cavities will be cleaned again. Have someone drive you to your first two postoperative appointments. We suggest that you take your prescribed pain medication about  hour prior to each of these two appointments.  3. How you care for your nose after the operation will influence the results that you obtain.  You should follow all directions, take your medication as prescribed, and call our office [phone] with any problems or questions. 4. You may be more comfortable sleeping with your head elevated on two pillows. 5. Do not take any medications that we have not prescribed or recommended. WARNING SIGNS: if any of the following should occur, please call our office: 1. Bright red bleeding which lasts more than 10 minutes. 2. Persistent fever greater than 102F. 3. Persistent vomiting. 4. Severe and constant pain that is not relieved by prescribed pain medication. 5. Trauma to the nose. 6. Rash or unusual side effects from any medicines.  General Anesthesia, Adult, Care After These instructions provide you with information about caring for yourself after your procedure. Your health care provider may also give you more specific instructions. Your treatment has been planned according to current medical practices, but problems sometimes occur. Call your health care provider if you have any problems or questions after your procedure. What can I expect after the procedure? After the procedure, it is common to have:  Vomiting.  A sore throat.  Mental slowness.  It is common to feel:  Nauseous.  Cold or shivery.  Sleepy.  Tired.  Sore or achy, even in parts of your body where you did not have surgery.  Follow these instructions at home: For at least 24 hours after the procedure:  Do not: ? Participate in activities where you  could fall or become injured. ? Drive. ? Use heavy machinery. ? Drink alcohol. ? Take sleeping pills or medicines that cause drowsiness. ? Make important decisions or sign legal documents. ? Take care of children on your own.  Rest. Eating and drinking  If you vomit, drink water, juice, or soup when you can drink without vomiting.  Drink enough fluid to keep your urine clear or pale yellow.  Make sure you have little or no nausea before eating solid foods.  Follow the diet recommended by your health care provider. General instructions  Have a responsible adult stay with you until you are awake and alert.  Return to your normal activities as told by your health care provider. Ask your health care provider what activities are safe for you.  Take over-the-counter and prescription medicines only as told by your health care provider.  If you smoke, do not smoke without supervision.  Keep all follow-up visits as told by your health care provider. This is important. Contact a health care provider if:  You continue to have nausea or vomiting at home, and medicines are not helpful.  You cannot drink fluids or start eating again.  You cannot urinate after 8-12 hours.  You develop a skin rash.  You have fever.  You have increasing redness at the site of your procedure. Get help right away if:  You have difficulty breathing.  You have chest pain.  You have unexpected bleeding.  You feel that you are having a life-threatening or urgent problem. This information is not intended to replace advice given to you by your health care provider. Make sure you discuss any questions you have with your health care provider. Document Released: 08/06/2000 Document Revised: 10/03/2015 Document Reviewed: 04/14/2015 Elsevier Interactive Patient Education  2018 [facility] Instructions  Activity: Get plenty of rest for the remainder of the day. A responsible  individual must stay with you for 24 hours following the procedure.  For the next 24 hours, DO NOT: -Drive a car -Paediatric nurse -Drink alcoholic beverages -Take any medication unless instructed by your physician -Make any legal decisions or sign important papers.  Meals: Start with liquid foods such as gelatin or soup. Progress to regular foods as tolerated. Avoid greasy, spicy, heavy foods. If nausea and/or vomiting occur, drink only clear liquids until the nausea and/or vomiting subsides. Call your physician if vomiting continues.  Special Instructions/Symptoms: Your throat may feel dry or sore from the anesthesia or the breathing tube placed in your throat during surgery. If this causes discomfort, gargle with warm salt water. The discomfort should disappear within 24 hours.  If you had a scopolamine patch placed behind your ear for the management of post- operative nausea and/or vomiting:  1. The medication in the patch is effective for 72 hours, after which it should be removed.  Wrap patch in a tissue and discard in the trash. Wash hands thoroughly with soap and water. 2. You may remove the patch earlier than 72 hours if you experience unpleasant side effects which may include dry mouth, dizziness or visual disturbances. 3. Avoid  touching the patch. Wash your hands with soap and water after contact with the patch.

## 2017-12-24 NOTE — Anesthesia Preprocedure Evaluation (Addendum)
Anesthesia Evaluation  Patient identified by MRN, date of birth, ID band Patient awake    Reviewed: Allergy & Precautions, NPO status , Patient's Chart, lab work & pertinent test results  Airway Mallampati: II  TM Distance: >3 FB     Dental no notable dental hx. (+) Teeth Intact   Pulmonary  Deviated nasal septum Turbinate hypertrophy bilateral   Pulmonary exam normal breath sounds clear to auscultation       Cardiovascular negative cardio ROS Normal cardiovascular exam Rhythm:Regular Rate:Normal     Neuro/Psych negative neurological ROS  negative psych ROS   GI/Hepatic Neg liver ROS, hiatal hernia, Hx/o Pancreatitis   Endo/Other  Obesity  Renal/GU negative Renal ROS  negative genitourinary   Musculoskeletal negative musculoskeletal ROS (+)   Abdominal (+) + obese,   Peds  Hematology   Anesthesia Other Findings   Reproductive/Obstetrics                           Anesthesia Physical Anesthesia Plan  ASA: II  Anesthesia Plan: General   Post-op Pain Management:    Induction:   PONV Risk Score and Plan: 4 or greater and Scopolamine patch - Pre-op, Midazolam, Ondansetron, Dexamethasone and Treatment may vary due to age or medical condition  Airway Management Planned: Oral ETT  Additional Equipment:   Intra-op Plan:   Post-operative Plan: Extubation in OR  Informed Consent: I have reviewed the patients History and Physical, chart, labs and discussed the procedure including the risks, benefits and alternatives for the proposed anesthesia with the patient or authorized representative who has indicated his/her understanding and acceptance.   Dental advisory given  Plan Discussed with: CRNA, Anesthesiologist and Surgeon  Anesthesia Plan Comments:         Anesthesia Quick Evaluation

## 2017-12-24 NOTE — Anesthesia Procedure Notes (Signed)
Procedure Name: Intubation Date/Time: 12/24/2017 9:02 AM Performed by: Lieutenant Diego, CRNA Pre-anesthesia Checklist: Patient identified, Emergency Drugs available, Suction available and Patient being monitored Patient Re-evaluated:Patient Re-evaluated prior to induction Oxygen Delivery Method: Circle system utilized Preoxygenation: Pre-oxygenation with 100% oxygen Induction Type: IV induction Ventilation: Mask ventilation without difficulty Laryngoscope Size: Miller and 2 Grade View: Grade I Tube type: Oral Tube size: 7.0 mm Number of attempts: 1 Airway Equipment and Method: Stylet Placement Confirmation: ETT inserted through vocal cords under direct vision,  positive ETCO2 and breath sounds checked- equal and bilateral Secured at: 22 cm Tube secured with: Tape Dental Injury: Teeth and Oropharynx as per pre-operative assessment

## 2017-12-24 NOTE — H&P (Signed)
Cc: Chronic nasal obstruction  HPI: The patient is a 42 year old female who returns today for her follow-up evaluation.  The patient has a history of chronic nasal obstruction and recurrent facial pain.  She was previously diagnosed with multiple sinus infections.  She was treated with multiple courses of antibiotics. At her most recent visit, she was noted to have severe bidirectional nasal septal deviation and bilateral inferior turbinate hypertrophy.  No acute infection was noted at that time.  She subsequently underwent a paranasal sinus CT scan.  The CT showed no evidence of acute or chronic sinusitis.  However, she has severe bidirectional nasal septal deviation and bilateral inferior turbinate hypertrophy.  A large left septal spur was noted to be impinging on the lateral nasal wall.  This may be the source of her chronic facial pain.  The patient returns today complaining of persistent nasal obstruction.  She is interested in more definitive treatment of her condition.  She has not responded to multiple courses of antibiotics and maximal allergy treatment, which includes Flonase, Singulair, and Zyrtec. No other ENT, GI, or respiratory issue noted since the last visit.   Exam: General: Communicates without difficulty, well nourished, no acute distress. Head: Normocephalic, no evidence injury, no tenderness, facial buttresses intact without stepoff. Eyes: PERRL, EOMI. No scleral icterus, conjunctivae clear. Neuro: CN II exam reveals vision grossly intact. No nystagmus at any point of gaze. Ears: Auricles well formed without lesions. Ear canals are intact without mass or lesion. No erythema or edema is appreciated. The TMs are intact without fluid. Nose: External evaluation reveals normal support and skin without lesions. Dorsum is intact. Anterior rhinoscopy reveals congested and edematous mucosa over anterior aspect of the inferior turbinates and deviated nasal septum. No purulence is noted. Oral:  Oral  cavity and oropharynx are intact, symmetric, without erythema or edema. Mucosa is moist without lesions. Neck: Full range of motion without pain.   Assessment: 1.  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  2.  Chronic rhinitis with nasal mucosal congestion.  3.  The patient's recent CT showed no evidence of acute or chronic sinusitis.   Plan: 1.  The nasal endoscopy findings and the CT images are extensively discussed with the patient.   2.  The treatment options are also discussed.  The options include continuing medical management versus surgical intervention with septoplasty and bilateral turbinate reduction.  The risks, benefits, alternatives and details of the treatment options are reviewed.  3.  The patient is interested in proceeding with the septoplasty and turbinate reduction procedure.  We will schedule the procedure in accordance with the patient's schedule.

## 2017-12-24 NOTE — Transfer of Care (Signed)
Immediate Anesthesia Transfer of Care Note  Patient: Cathy [name]  Procedure(s) Performed: NASAL SEPTOPLASTY WITH TURBINATE REDUCTION (Bilateral Nose)  Patient Location: PACU  Anesthesia Type:General  Level of Consciousness: awake  Airway & Oxygen Therapy: Patient Spontanous Breathing and Patient connected to face mask oxygen  Post-op Assessment: Report given to RN and Post -op Vital signs reviewed and stable  Post vital signs: Reviewed and stable  Last Vitals:  Vitals Value Taken Time  BP 145/84 12/24/2017 10:25 AM  Temp    Pulse 75 12/24/2017 10:26 AM  Resp 10 12/24/2017 10:26 AM  SpO2 100 % 12/24/2017 10:26 AM  Vitals shown include unvalidated device data.  Last Pain:  Vitals:   12/24/17 0800  TempSrc: Oral  PainSc: 0-No pain      Patients Stated Pain Goal: 0 (77/93/90 3009)  Complications: No apparent anesthesia complications

## 2017-12-24 NOTE — Op Note (Signed)
DATE OF PROCEDURE: 12/24/2017  OPERATIVE REPORT   SURGEON: Leta Baptist, MD   PREOPERATIVE DIAGNOSES:  1. Severe nasal septal deviation.  2. Bilateral inferior turbinate hypertrophy.  3. Chronic nasal obstruction.  POSTOPERATIVE DIAGNOSES:  1. Severe nasal septal deviation.  2. Bilateral inferior turbinate hypertrophy.  3. Chronic nasal obstruction.  PROCEDURE PERFORMED:  1. Septoplasty.  2. Bilateral partial inferior turbinate resection.   ANESTHESIA: General endotracheal tube anesthesia.   COMPLICATIONS: None.   ESTIMATED BLOOD LOSS: 50 mL.   INDICATION FOR PROCEDURE: Cathy [name] is a 42 y.o. female with a history of chronic nasal obstruction. The patient was  treated with antihistamine, decongestant, and steroid nasal spray. However, the patient continued to be symptomatic. On examination, the patient was noted to have bilateral severe inferior turbinate hypertrophy and significant nasal septal deviation, causing significant nasal obstruction. Based on the above findings, the decision was made for the patient to undergo the above-stated procedures. The risks, benefits, alternatives, and details of the procedures were discussed with the patient. Questions were invited and answered. Informed consent was obtained.   DESCRIPTION OF PROCEDURE: The patient was taken to the operating room and placed supine on the operating table. General endotracheal tube anesthesia was administered by the anesthesiologist. The patient was positioned, and prepped and draped in the standard fashion for nasal surgery. Pledgets soaked with Afrin were placed in both nasal cavities for decongestion. The pledgets were subsequently removed. The above mentioned severe septal deviation was again noted. 1% lidocaine with 1:100,000 epinephrine was injected onto the nasal septum bilaterally. A hemitransfixion incision was made on the left side. The mucosal flap was carefully elevated on the left side. A cartilaginous  incision was made 1 cm superior to the caudal margin of the nasal septum. Mucosal flap was also elevated on the right side in the similar fashion. It should be noted that due to the severe septal deviation, the deviated portion of the cartilaginous and bony septum had to be removed in piecemeal fashion. Once the deviated portions were removed, a straight midline septum was achieved. The septum was then quilted with 4-0 plain gut sutures. The hemitransfixion incision was closed with interrupted 4-0 chromic sutures. Doyle splints were applied.   Prior to the [facility] splint application, the inferior one half of both hypertrophied inferior turbinate was crossclamped with a Kelly clamp. The inferior one half of each inferior turbinate was then resected with a pair of cross cutting scissors. Hemostasis was achieved with a suction cautery device.   The care of the patient was turned over to the anesthesiologist. The patient was awakened from anesthesia without difficulty. The patient was extubated and transferred to the recovery room in good condition.   OPERATIVE FINDINGS: Severe nasal septal deviation and bilateral inferior turbinate hypertrophy.   SPECIMEN: None.   FOLLOWUP CARE: The patient be discharged home once she is awake and alert. The patient will be placed on Percocet 1 tablets p.o. q.4 hours p.r.n. pain, and amoxicillin 875 mg p.o. b.i.d. for 3 days. The patient will follow up in my office for splint removal.   Cathy Tidmore Raynelle Bring, MD

## 2017-12-24 NOTE — Anesthesia Postprocedure Evaluation (Signed)
Anesthesia Post Note  Patient: Cathy [name]  Procedure(s) Performed: NASAL SEPTOPLASTY WITH TURBINATE REDUCTION (Bilateral Nose)     Patient location during evaluation: PACU Anesthesia Type: General Level of consciousness: awake and alert and oriented Pain management: pain level controlled Vital Signs Assessment: post-procedure vital signs reviewed and stable Respiratory status: spontaneous breathing, nonlabored ventilation and respiratory function stable Cardiovascular status: blood pressure returned to baseline and stable Postop Assessment: no apparent nausea or vomiting Anesthetic complications: no    Last Vitals:  Vitals:   12/24/17 1100 12/24/17 1115  BP: (!) 151/82 (!) 145/75  Pulse: 68 69  Resp: 12 12  Temp:    SpO2: 100% 97%    Last Pain:  Vitals:   12/24/17 1112  TempSrc:   PainSc: 3                  Madai Nuccio A.

## 2017-12-25 ENCOUNTER — Encounter (HOSPITAL_BASED_OUTPATIENT_CLINIC_OR_DEPARTMENT_OTHER): Payer: Self-pay | Admitting: Otolaryngology

## 2017-12-26 ENCOUNTER — Ambulatory Visit (INDEPENDENT_AMBULATORY_CARE_PROVIDER_SITE_OTHER): Payer: 59 | Admitting: Otolaryngology

## 2018-01-09 ENCOUNTER — Ambulatory Visit (INDEPENDENT_AMBULATORY_CARE_PROVIDER_SITE_OTHER): Payer: 59 | Admitting: Otolaryngology

## 2018-07-07 ENCOUNTER — Ambulatory Visit (INDEPENDENT_AMBULATORY_CARE_PROVIDER_SITE_OTHER): Payer: 59 | Admitting: Otolaryngology

## 2018-07-07 DIAGNOSIS — R0982 Postnasal drip: Secondary | ICD-10-CM

## 2018-07-07 DIAGNOSIS — J310 Chronic rhinitis: Secondary | ICD-10-CM | POA: Diagnosis not present

## 2019-09-09 ENCOUNTER — Ambulatory Visit (INDEPENDENT_AMBULATORY_CARE_PROVIDER_SITE_OTHER): Payer: 59 | Admitting: Internal Medicine

## 2019-10-06 ENCOUNTER — Encounter (INDEPENDENT_AMBULATORY_CARE_PROVIDER_SITE_OTHER): Payer: Self-pay | Admitting: Internal Medicine

## 2019-10-06 ENCOUNTER — Ambulatory Visit (INDEPENDENT_AMBULATORY_CARE_PROVIDER_SITE_OTHER): Payer: 59 | Admitting: Internal Medicine

## 2019-10-06 ENCOUNTER — Other Ambulatory Visit: Payer: Self-pay

## 2019-10-06 VITALS — BP 135/85 | HR 89 | Temp 98.1°F | Ht 63.0 in | Wt 185.0 lb

## 2019-10-06 DIAGNOSIS — E282 Polycystic ovarian syndrome: Secondary | ICD-10-CM | POA: Diagnosis not present

## 2019-10-06 DIAGNOSIS — E559 Vitamin D deficiency, unspecified: Secondary | ICD-10-CM

## 2019-10-06 DIAGNOSIS — R5381 Other malaise: Secondary | ICD-10-CM

## 2019-10-06 DIAGNOSIS — E669 Obesity, unspecified: Secondary | ICD-10-CM | POA: Diagnosis not present

## 2019-10-06 DIAGNOSIS — Z131 Encounter for screening for diabetes mellitus: Secondary | ICD-10-CM

## 2019-10-06 DIAGNOSIS — R5383 Other fatigue: Secondary | ICD-10-CM

## 2019-10-06 NOTE — Progress Notes (Signed)
Metrics: Intervention Frequency ACO  Documented Smoking Status Yearly  Screened one or more times in 24 months  Cessation Counseling or  Active cessation medication Past 24 months  Past 24 months   Guideline developer: UpToDate (See UpToDate for funding source) Date Released: 2014       Wellness Office Visit  Subjective:  Patient ID: Cathy [name], female    DOB: 08-15-1975  Age: 44 y.o. MRN: [id number]  CC: This 44 year old lady comes to our practice as a new patient to establish care.  She was referred by her boyfriend's family. She has several complaints which are described below. HPI  She has fatigue, reduced focus and concentration, hair loss.  She also describes a history of several miscarriages. She also has aches and pains throughout her body. She would like to lose weight and become healthier.  She tells me that when she was younger as an adult, she weighed about 50 to 60 pounds lower than she does now. She also describes insomnia. She describes some acne on her face also. She tends to say her bodies remains somewhat cold. Past Medical History:  Diagnosis Date  . Missed ab   . Seasonal allergies    Past Surgical History:  Procedure Laterality Date  . CARPAL TUNNEL RELEASE  04/09/2012   Procedure: CARPAL TUNNEL RELEASE;  Surgeon: [name], MD;  Location: [facility];  Service: Orthopedics;  Laterality: Right;  . CESAREAN SECTION  12/17/2011   Procedure: CESAREAN SECTION; x1  Surgeon: [name], MD;  Location: [facility];  Service: Gynecology;  Laterality: N/A;  . CHOLECYSTECTOMY  11/06/2004  . DILATION AND EVACUATION N/A 08/17/2014   Procedure: DILATATION AND EVACUATION;  Surgeon: Arvella Nigh, MD;  Location: [facility];  Service: Gynecology;  Laterality: N/A;  . DILATION AND EVACUATION N/A 03/07/2015   Procedure: DILATATION AND EVACUATION;  Surgeon: Arvella Nigh, MD;  Location: [facility];  Service: Gynecology;  Laterality: N/A;  nonviable at 8 weeks O positive  .  DILATION AND EVACUATION N/A 05/04/2016   Procedure: DILATATION AND EVACUATION;  Surgeon: Arvella Nigh, MD;  Location: [facility];  Service: Gynecology;  Laterality: N/A;  . DORSAL COMPARTMENT RELEASE  04/09/2012   Procedure: RELEASE DORSAL COMPARTMENT (DEQUERVAIN);  Surgeon: [name], MD;  Location: [facility];  Service: Orthopedics;  Laterality: Right;  RELEASE 1ST DORSAL RIGHT  . LAPAROSCOPIC ENDOMETRIOSIS FULGURATION  1996  . NASAL SEPTOPLASTY W/ TURBINOPLASTY Bilateral 12/24/2017   Procedure: NASAL SEPTOPLASTY WITH TURBINATE REDUCTION;  Surgeon: Leta Baptist, MD;  Location: [facility];  Service: ENT;  Laterality: Bilateral;     Family History  Problem Relation Age of Onset  . Colon cancer Mother 65  . Heart failure Father   . Hypertension Father   . Heart disease Father   . Pancreatic cancer Maternal Grandmother   . Colon cancer Paternal Grandmother   . Prostate cancer Maternal Grandfather   . Pancreatitis Neg Hx     Social History   Social History Narrative   Lives with boyfriend (6 months) and son.  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   Social History   Tobacco Use  . Smoking status: Never Smoker  . Smokeless tobacco: Never Used  Substance Use Topics  . Alcohol use: Yes    Alcohol/week: 2.0 standard drinks    Types: 2 Cans of beer per week    Comment: occasional    Current Meds  Medication  Sig  . loratadine (CLARITIN) 10 MG tablet Take 10 mg by mouth daily.  Marland Kitchen omeprazole (PRILOSEC) 10 MG capsule Take 20 mg by mouth daily.     No flowsheet data found.   Objective:   Today's Vitals: BP 135/85 (BP Location: Left Arm, Patient Position: Sitting, Cuff Size: Normal)   Pulse 89   Temp 98.1 F (36.7 C) (Temporal)   Ht 5\' 3"  (1.6 m)   Wt 185 lb (83.9 kg)   SpO2 92%   BMI 32.77 kg/m  Vitals with BMI 10/06/2019 12/24/2017 12/24/2017  Height 5\' 3"  - -  Weight 185 lbs - -  BMI [id number]  - -  Systolic [id number] [id number]  Diastolic 85 80 75  Pulse 89 64 69     Physical Exam   She looks systemically well.  She is obese.  Blood pressure borderline elevated.    Assessment   1. Obesity (BMI 30-39.9)   2. Malaise and fatigue   3. Vitamin D deficiency disease   4. PCOS (polycystic ovarian syndrome)   5. Screening for diabetes mellitus       Tests ordered Orders Placed This Encounter  Procedures  . Cardio IQ Adv Lipid and Inflamm Pnl  . CBC  . COMPLETE METABOLIC PANEL WITH GFR  . VITAMIN D 25 Hydroxy (Vit-D Deficiency, Fractures)  . Follicle stimulating hormone  . Luteinizing hormone  . T3, free  . T4  . TSH  . Hemoglobin A1c  . Other/Misc lab test     Plan: 1. I believe this lady may have PCOS with her history of several miscarriages, previous irregularity of her cycles and acne on her face that she is now experiencing.  We will check blood work regarding this with [place] and [place] levels. 2. In terms of her obesity, we will do an insulin resistance panel with score. 3. She may also have thyroid deficiency we will check thyroid functions. 4. I told her that after right now she should take vitamin D3 2000 units daily but I will check her levels today also. 5. I will see her in about a month's time to discuss all results and further recommendations. 6. Today I spent 30 to 35 minutes with this patient discussing her symptoms and the philosophy of the practice.   No orders of the defined types were placed in this encounter.   Doree Albee, MD

## 2019-10-10 LAB — COMPLETE METABOLIC PANEL WITH GFR
AG Ratio: 1.7 (calc) (ref 1.0–2.5)
ALT: 21 U/L (ref 6–29)
AST: 19 U/L (ref 10–30)
Albumin: 4.4 g/dL (ref 3.6–5.1)
Alkaline phosphatase (APISO): 78 U/L (ref 31–125)
BUN: 12 mg/dL (ref 7–25)
CO2: 25 mmol/L (ref 20–32)
Calcium: 9.1 mg/dL (ref 8.6–10.2)
Chloride: 104 mmol/L (ref 98–110)
Creat: 0.9 mg/dL (ref 0.50–1.10)
GFR, Est African American: 91 mL/min/{1.73_m2} (ref >=60)
GFR, Est Non African American: 78 mL/min/{1.73_m2} (ref >=60)
Globulin: 2.6 g/dL (calc) (ref 1.9–3.7)
Glucose, Bld: 124 mg/dL — ABNORMAL HIGH (ref 65–99)
Potassium: 3.7 mmol/L (ref 3.5–5.3)
Sodium: 138 mmol/L (ref 135–146)
Total Bilirubin: 0.4 mg/dL (ref 0.2–1.2)
Total Protein: 7 g/dL (ref 6.1–8.1)

## 2019-10-10 LAB — CBC
HCT: 40.1 % (ref 35.0–45.0)
Hemoglobin: 13.5 g/dL (ref 11.7–15.5)
MCH: 32 pg (ref 27.0–33.0)
MCHC: 33.7 g/dL (ref 32.0–36.0)
MCV: 95 fL (ref 80.0–100.0)
MPV: 9.8 fL (ref 7.5–12.5)
Platelets: 225 10*3/uL (ref 140–400)
RBC: 4.22 10*6/uL (ref 3.80–5.10)
RDW: 13 % (ref 11.0–15.0)
WBC: 6.6 10*3/uL (ref 3.8–10.8)

## 2019-10-10 LAB — HEMOGLOBIN A1C
Hgb A1c MFr Bld: 5.2 % of total Hgb (ref <5.7)
Mean Plasma Glucose: 103 (calc)
eAG (mmol/L): 5.7 (calc)

## 2019-10-10 LAB — VITAMIN D 25 HYDROXY (VIT D DEFICIENCY, FRACTURES): Vit D, 25-Hydroxy: 23 ng/mL — ABNORMAL LOW (ref 30–100)

## 2019-10-10 LAB — TSH: TSH: 1.94 mIU/L

## 2019-10-10 LAB — CARDIO IQ ADV LIPID AND INFLAMM PNL
Apolipoprotein B: 110 mg/dL — ABNORMAL HIGH (ref <90)
Cholesterol: 224 mg/dL — ABNORMAL HIGH (ref <200)
HDL: 55 mg/dL (ref >49)
LDL Cholesterol (Calc): 136 mg/dL (calc) — ABNORMAL HIGH (ref <100)
LDL Large: 11405 nmol/L (ref >6729)
LDL Medium: 607 nmol/L — ABNORMAL HIGH (ref <215)
LDL Particle Number: 2313 nmol/L — ABNORMAL HIGH (ref <1138)
LDL Peak Size: 215.6 Angstrom — ABNORMAL LOW (ref >222.9)
LDL Small: 517 nmol/L — ABNORMAL HIGH (ref <142)
Lipoprotein (a): <10 nmol/L (ref <75)
Non-HDL Cholesterol (Calc): 169 mg/dL (calc) — ABNORMAL HIGH (ref <130)
PLAC: 115 nmol/min/mL (ref <124)
Total CHOL/HDL Ratio: 4.1 calc — ABNORMAL HIGH (ref <3.6)
Triglycerides: 191 mg/dL — ABNORMAL HIGH (ref <150)
hs-CRP: 2.4 mg/L — ABNORMAL HIGH (ref <1.0)

## 2019-10-10 LAB — CARDIO IQ INSULIN RESISTANCE PANEL WITH SCORE
C-PEPTIDE, LC/MS/MS: 5.79 ng/mL — ABNORMAL HIGH (ref 0.68–2.16)
INSULIN, INTACT, LC/MS/MS: 50 u[IU]/mL — ABNORMAL HIGH (ref <=16)
Insulin Resistance Score: 100 — ABNORMAL HIGH (ref <=66)

## 2019-10-10 LAB — FOLLICLE STIMULATING HORMONE: FSH: 5.2 m[IU]/mL

## 2019-10-10 LAB — T4: T4, Total: 7.8 ug/dL (ref 5.1–11.9)

## 2019-10-10 LAB — T3, FREE: T3, Free: 3.1 pg/mL (ref 2.3–4.2)

## 2019-10-10 LAB — LUTEINIZING HORMONE: LH: 2.9 m[IU]/mL

## 2019-11-12 ENCOUNTER — Ambulatory Visit (INDEPENDENT_AMBULATORY_CARE_PROVIDER_SITE_OTHER): Payer: 59 | Admitting: Internal Medicine

## 2019-11-18 ENCOUNTER — Ambulatory Visit
Admission: EM | Admit: 2019-11-18 | Discharge: 2019-11-18 | Disposition: A | Discharge status: Home / Self Care | Payer: 59 | Attending: Emergency Medicine | Admitting: Emergency Medicine

## 2019-11-18 ENCOUNTER — Other Ambulatory Visit: Payer: Self-pay

## 2019-11-18 ENCOUNTER — Encounter: Payer: Self-pay | Admitting: Emergency Medicine

## 2019-11-18 DIAGNOSIS — B029 Zoster without complications: Secondary | ICD-10-CM

## 2019-11-18 MED ORDER — VALACYCLOVIR HCL 1 G PO TABS: 1000.0000 mg | ORAL_TABLET | Freq: Three times a day (TID) | ORAL | 0 refills | Status: DC

## 2019-11-18 MED ORDER — PREDNISONE 10 MG (21) PO TBPK: ORAL_TABLET | ORAL | 0 refills | Status: DC

## 2019-11-18 NOTE — ED Triage Notes (Signed)
Left side rash area that started last night that is painful.

## 2019-11-18 NOTE — ED Provider Notes (Signed)
[facility]   [id number] 11/18/19 Arrival Time: 1926   Chief Complaint  Patient presents with  . Rash     SUBJECTIVE: History from: patient.  Cathy [name] is a 44 y.o. female who presented to the urgent care with a complaint of rash to left side of her back for the past 1 to 2 days.  She denies changes in soaps, detergents, or anyone with similar symptoms.  She localizes the rash to her left lower back.  She describes it as painful and burning.  Has not tried any OTC medication.  Denies alleviating factors.  She reports similar symptoms in the past that improved with with valacyclovir and prednisone.  Denies chills, fever, nausea, vomiting, diarrhea.  ROS: As per HPI.  All other pertinent ROS negative.     Past Medical History:  Diagnosis Date  . Missed ab   . Seasonal allergies    Past Surgical History:  Procedure Laterality Date  . CARPAL TUNNEL RELEASE  04/09/2012   Procedure: CARPAL TUNNEL RELEASE;  Surgeon: [name], MD;  Location: [facility];  Service: Orthopedics;  Laterality: Right;  . CESAREAN SECTION  12/17/2011   Procedure: CESAREAN SECTION; x1  Surgeon: [name], MD;  Location: [facility];  Service: Gynecology;  Laterality: N/A;  . CHOLECYSTECTOMY  11/06/2004  . DILATION AND EVACUATION N/A 08/17/2014   Procedure: DILATATION AND EVACUATION;  Surgeon: Arvella Nigh, MD;  Location: [facility];  Service: Gynecology;  Laterality: N/A;  . DILATION AND EVACUATION N/A 03/07/2015   Procedure: DILATATION AND EVACUATION;  Surgeon: Arvella Nigh, MD;  Location: [facility];  Service: Gynecology;  Laterality: N/A;  nonviable at 8 weeks O positive  . DILATION AND EVACUATION N/A 05/04/2016   Procedure: DILATATION AND EVACUATION;  Surgeon: Arvella Nigh, MD;  Location: [facility];  Service: Gynecology;  Laterality: N/A;  . DORSAL COMPARTMENT RELEASE  04/09/2012   Procedure: RELEASE DORSAL COMPARTMENT (DEQUERVAIN);  Surgeon: [name], MD;  Location:  [facility];  Service: Orthopedics;  Laterality: Right;  RELEASE 1ST DORSAL RIGHT  . LAPAROSCOPIC ENDOMETRIOSIS FULGURATION  1996  . NASAL SEPTOPLASTY W/ TURBINOPLASTY Bilateral 12/24/2017   Procedure: NASAL SEPTOPLASTY WITH TURBINATE REDUCTION;  Surgeon: Leta Baptist, MD;  Location: [facility];  Service: ENT;  Laterality: Bilateral;   Allergies  Allergen Reactions  . Other Other (See Comments) and Rash    PERFUMED SOAPS/LOTIONS - IS BOTHERED BY FRAGRANCES Gives pt headaches  . Sudafed [Pseudoephedrine] Other (See Comments)    Makes pt delirious   . Adhesive [Tape] Rash  . Sulfa Antibiotics Nausea And Vomiting   No current facility-administered medications on file prior to encounter.   Current Outpatient Medications on File Prior to Encounter  Medication Sig Dispense Refill  . loratadine (CLARITIN) 10 MG tablet Take 10 mg by mouth daily.    Marland Kitchen omeprazole (PRILOSEC) 10 MG capsule Take 20 mg by mouth daily.     Social History   Socioeconomic History  . Marital status: Legally Separated    Spouse name: Not on file  . Number of children: Not on file  . Years of education: Not on file  . Highest education level: Not on file  Occupational History  . Not on file  Tobacco Use  . Smoking status: Never Smoker  . Smokeless tobacco: Never Used  Substance and Sexual Activity  . Alcohol use: Yes    Alcohol/week: 2.0 standard drinks    Types: 2  Cans of beer per week    Comment: occasional  . Drug use: No  . Sexual activity: Yes  Other Topics Concern  . Not on file  Social History Narrative   Lives with boyfriend (6 months) and son.  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   Social Determinants of Health   Financial Resource Strain:   . Difficulty of Paying Living Expenses:   Food Insecurity:   . Worried About Charity fundraiser in the Last Year:   . Arboriculturist in the Last Year:   Transportation Needs:   . Lexicographer (Medical):   Marland Kitchen Lack of Transportation (Non-Medical):   Physical Activity:   . Days of Exercise per Week:   . Minutes of Exercise per Session:   Stress:   . Feeling of Stress :   Social Connections:   . Frequency of Communication with Friends and Family:   . Frequency of Social Gatherings with Friends and Family:   . Attends Religious Services:   . Active Member of Clubs or Organizations:   . Attends Archivist Meetings:   Marland Kitchen Marital Status:   Intimate Partner Violence:   . Fear of Current or Ex-Partner:   . Emotionally Abused:   Marland Kitchen Physically Abused:   . Sexually Abused:    Family History  Problem Relation Age of Onset  . Colon cancer Mother 69  . Heart failure Father   . Hypertension Father   . Heart disease Father   . Pancreatic cancer Maternal Grandmother   . Colon cancer Paternal Grandmother   . Prostate cancer Maternal Grandfather   . Pancreatitis Neg Hx     OBJECTIVE:  Vitals:   11/18/19 1933  BP: 136/83  Pulse: 93  Resp: 16  Temp: 98.1 F (36.7 C)  TempSrc: Oral  SpO2: 96%  Weight: 172 lb (78 kg)     Physical Exam Vitals and nursing note reviewed.  Constitutional:      General: She is not in acute distress.    Appearance: Normal appearance. She is normal weight. She is not ill-appearing, toxic-appearing or diaphoretic.  Cardiovascular:     Rate and Rhythm: Normal rate and regular rhythm.     Pulses: Normal pulses.     Heart sounds: Normal heart sounds. No murmur heard.  No friction rub. No gallop.   Pulmonary:     Effort: Pulmonary effort is normal. No respiratory distress.     Breath sounds: Normal breath sounds. No stridor. No wheezing, rhonchi or rales.  Chest:     Chest wall: No tenderness.  Skin:    General: Skin is warm.     Findings: Erythema and rash present. Rash is macular.     Comments: Macular rash at different stages with erythema  Neurological:     Mental Status: She is alert.     LABS:  No results  found for this or any previous visit (from the past 24 hour(s)).   ASSESSMENT & PLAN:  1. Herpes zoster without complication     Meds ordered this encounter  Medications  . valACYclovir (VALTREX) 1000 MG tablet    Sig: Take 1 tablet (1,000 mg total) by mouth 3 (three) times daily.    Dispense:  21 tablet    Refill:  0  . predniSONE (STERAPRED UNI-PAK 21 TAB) 10 MG (21) TBPK tablet    Sig: Take 6 tabs by mouth daily  for 1 days, then 5  tabs for 1 days, then 4 tabs for 1 days, then 3 tabs for 1 days, 2 tabs for 1 days, then 1 tab by mouth daily for 1days    Dispense:  21 tablet    Refill:  0    Discharge Instructions Rest and use ice/heat as needed for symptomatic relief Prescribed valacyclovir 1000mg  3x/day for 7 days Prescribed prednisone taper for inflammation and pain Use OTC medications such as ibuprofen/ tylenol.  Use tramadol as needed for break-through pain Follow up with PCP if symptoms of burning, stinging, tingling or numbness occur after rash resolves, you may need additional treatment Return here or go to ER if you have any new or worsening symptoms (such as eye involvement, severe pain, or signs of secondary infection such as fever, chills, nausea, vomiting, discharge, redness or warmth over site of rash)   Reviewed expectations re: course of current medical issues. Questions answered. Outlined signs and symptoms indicating need for more acute intervention. Patient verbalized understanding. After Visit Summary given.     Note: This document was prepared using Dragon voice recognition software and may include unintentional dictation errors.     Emerson Monte, FNP 11/18/19 1946

## 2019-11-18 NOTE — Discharge Instructions (Addendum)
Rest and use ice/heat as needed for symptomatic relief Prescribed valacyclovir 1000mg 3x/day for 7 days Prescribed prednisone taper for inflammation and pain Use OTC medications such as ibuprofen/ tylenol.  Use tramadol as needed for break-through pain Follow up with PCP if symptoms of burning, stinging, tingling or numbness occur after rash resolves, you may need additional treatment Return here or go to ER if you have any new or worsening symptoms (such as eye involvement, severe pain, or signs of secondary infection such as fever, chills, nausea, vomiting, discharge, redness or warmth over site of rash) 

## 2019-11-24 ENCOUNTER — Ambulatory Visit (INDEPENDENT_AMBULATORY_CARE_PROVIDER_SITE_OTHER): Payer: 59 | Admitting: Internal Medicine

## 2019-12-22 ENCOUNTER — Other Ambulatory Visit: Payer: Self-pay

## 2019-12-22 ENCOUNTER — Ambulatory Visit (INDEPENDENT_AMBULATORY_CARE_PROVIDER_SITE_OTHER): Payer: 59 | Admitting: Internal Medicine

## 2019-12-22 ENCOUNTER — Encounter (INDEPENDENT_AMBULATORY_CARE_PROVIDER_SITE_OTHER): Payer: Self-pay | Admitting: Internal Medicine

## 2019-12-22 VITALS — BP 124/83 | HR 89 | Temp 97.9°F | Resp 18 | Ht 63.0 in | Wt 178.0 lb

## 2019-12-22 DIAGNOSIS — E559 Vitamin D deficiency, unspecified: Secondary | ICD-10-CM | POA: Diagnosis not present

## 2019-12-22 DIAGNOSIS — R5381 Other malaise: Secondary | ICD-10-CM

## 2019-12-22 DIAGNOSIS — R5383 Other fatigue: Secondary | ICD-10-CM

## 2019-12-22 DIAGNOSIS — E669 Obesity, unspecified: Secondary | ICD-10-CM

## 2019-12-22 DIAGNOSIS — R6889 Other general symptoms and signs: Secondary | ICD-10-CM

## 2019-12-22 DIAGNOSIS — L659 Nonscarring hair loss, unspecified: Secondary | ICD-10-CM

## 2019-12-22 MED ORDER — NP THYROID 30 MG PO TABS: 30.0000 mg | ORAL_TABLET | Freq: Every day | ORAL | 3 refills | Status: DC

## 2019-12-22 NOTE — Progress Notes (Signed)
Metrics: Intervention Frequency ACO  Documented Smoking Status Yearly  Screened one or more times in 24 months  Cessation Counseling or  Active cessation medication Past 24 months  Past 24 months   Guideline developer: UpToDate (See UpToDate for funding source) Date Released: 2014       Wellness Office Visit  Subjective:  Patient ID: Cathy [name], female    DOB: [birth year]  Age: 44 y.o. MRN: [id number]  CC: This lady comes in for follow-up regarding her blood work and further recommendations. HPI  Since the last time I saw her she had a bout of shingles and I have recommended that in the next few months she should probably look into getting the shingles vaccine. We discussed all her blood results today. She has vitamin D deficiency. Her lipid IQ panel is showing LDL particle number elevated and high risk for coronary artery disease in the future. Insulin resistance score is elevated and she clearly has insulin resistance even though her hemoglobin A1c is only 5.2%. FSH/LH levels do not seem to suggest diagnosis of PCOS at the present time. On closer questioning today she does describe hair loss and cold intolerance.  She always feels cold all the time.  She also has a tendency to constipation and has a degree of brain fog. Past Medical History:  Diagnosis Date  . Missed ab   . Seasonal allergies    Past Surgical History:  Procedure Laterality Date  . CARPAL TUNNEL RELEASE  04/09/2012   Procedure: CARPAL TUNNEL RELEASE;  Surgeon: [name], MD;  Location: [facility];  Service: Orthopedics;  Laterality: Right;  . CESAREAN SECTION  12/17/2011   Procedure: CESAREAN SECTION; x1  Surgeon: [name], MD;  Location: [facility];  Service: Gynecology;  Laterality: N/A;  . CHOLECYSTECTOMY  11/06/2004  . DILATION AND EVACUATION N/A 08/17/2014   Procedure: DILATATION AND EVACUATION;  Surgeon: Arvella Nigh, MD;  Location: [facility];  Service: Gynecology;  Laterality: N/A;  . DILATION  AND EVACUATION N/A 03/07/2015   Procedure: DILATATION AND EVACUATION;  Surgeon: Arvella Nigh, MD;  Location: [facility];  Service: Gynecology;  Laterality: N/A;  nonviable at 8 weeks O positive  . DILATION AND EVACUATION N/A 05/04/2016   Procedure: DILATATION AND EVACUATION;  Surgeon: Arvella Nigh, MD;  Location: [facility];  Service: Gynecology;  Laterality: N/A;  . DORSAL COMPARTMENT RELEASE  04/09/2012   Procedure: RELEASE DORSAL COMPARTMENT (DEQUERVAIN);  Surgeon: [name], MD;  Location: [facility];  Service: Orthopedics;  Laterality: Right;  RELEASE 1ST DORSAL RIGHT  . LAPAROSCOPIC ENDOMETRIOSIS FULGURATION  1996  . NASAL SEPTOPLASTY W/ TURBINOPLASTY Bilateral 12/24/2017   Procedure: NASAL SEPTOPLASTY WITH TURBINATE REDUCTION;  Surgeon: Leta Baptist, MD;  Location: [facility];  Service: ENT;  Laterality: Bilateral;     Family History  Problem Relation Age of Onset  . Colon cancer Mother 10  . Heart failure Father   . Hypertension Father   . Heart disease Father   . Pancreatic cancer Maternal Grandmother   . Colon cancer Paternal Grandmother   . Prostate cancer Maternal Grandfather   . Pancreatitis Neg Hx     Social History   Social History Narrative   Lives with boyfriend (6 months) and son.  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   Social History   Tobacco Use  . Smoking status: Never Smoker  . Smokeless tobacco: Never Used  Substance Use Topics  .  Alcohol use: Yes    Alcohol/week: 2.0 standard drinks    Types: 2 Cans of beer per week    Comment: occasional    No outpatient medications have been marked as taking for the 12/22/19 encounter (Office Visit) with Doree Albee, MD.       No flowsheet data found.   Objective:   Today's Vitals: BP 124/83 (BP Location: Left Arm, Patient Position: Sitting, Cuff Size: Normal)   Pulse 89   Temp 97.9 F (36.6 C)   Resp 18   Ht 5\' 3"  (1.6  m)   Wt 178 lb (80.7 kg)   SpO2 98%   BMI 31.53 kg/m  Vitals with BMI 12/22/2019 11/18/2019 10/06/2019  Height 5\' 3"  - 5\' 3"   Weight 178 lbs 172 lbs 185 lbs  BMI 81.15 - 72.62  Systolic 035 [id number] 416  Diastolic 83 83 85  Pulse 89 93 89     Physical Exam  She looks systemically well.  She remains obese although she has lost weight since the last time I saw her.     Assessment   1. Obesity (BMI 30-39.9)   2. Vitamin D deficiency disease   3. Malaise and fatigue   4. Cold intolerance   5. Hair loss       Tests ordered No orders of the defined types were placed in this encounter.    Plan: 1. We discussed her obesity and the desire to lose weight in detail.  I described to her the concept of intermittent fasting and the rationale along with a plant-based diet.  She will try to do this.  I encouraged her to drink plenty of water throughout the day. 2. I recommended she start taking vitamin D3 10,000 units daily for vitamin D deficiency. 3. I do believe she has symptoms of thyroid deficiency and I am going to prescribe for her NP thyroid 30 mg daily, off label, for symptoms of thyroid deficiency.  I explained possible side effects and how to deal with them. 4. I will see her in about 6 weeks time for follow-up to see how she is doing and we will likely check blood work then again. 5. Today I spent 45 to 50 minutes with this patient discussing all results, discussing nutrition and also thyroid dysfunction.   Meds ordered this encounter  Medications  . NP THYROID 30 MG tablet    Sig: Take 1 tablet (30 mg total) by mouth daily before breakfast.    Dispense:  30 tablet    Refill:  3    Krish Bailly Luther Parody, MD

## 2019-12-22 NOTE — Patient Instructions (Addendum)
[name] Dietary Recommendations for Weight Loss What to Avoid . Avoid added sugars o Often added sugar can be found in processed foods such as many condiments, dry cereals, cakes, cookies, chips, crisps, crackers, candies, sweetened drinks, etc.  o Read labels and AVOID/DECREASE use of foods with the following in their ingredient list: Sugar, fructose, high fructose corn syrup, sucrose, glucose, maltose, dextrose, molasses, cane sugar, brown sugar, any type of syrup, agave nectar, etc.   . Avoid snacking in between meals . Avoid foods made with flour o If you are going to eat food made with flour, choose those made with whole-grains; and, minimize your consumption as much as is tolerable . Avoid processed foods o These foods are generally stocked in the middle of the grocery store. Focus on shopping on the perimeter of the grocery.  . Avoid Meat  o We recommend following a plant-based diet at [name]. Thus, we recommend avoiding meat as a general rule. Consider eating beans, legumes, eggs, and/or dairy products for regular protein sources o If you plan on eating meat limit to 4 ounces of meat at a time and choose lean options such as Fish, chicken, turkey. Avoid red meat intake such as pork and/or steak What to Include . Vegetables o GREEN LEAFY VEGETABLES: Kale, spinach, mustard greens, collard greens, cabbage, broccoli, etc. o OTHER: Asparagus, cauliflower, eggplant, carrots, peas, Brussel sprouts, tomatoes, bell peppers, zucchini, beets, cucumbers, etc. . Grains, seeds, and legumes o Beans: kidney beans, black eyed peas, garbanzo beans, black beans, pinto beans, etc. o Whole, unrefined grains: brown rice, barley, bulgur, oatmeal, etc. . Healthy fats  o Avoid highly processed fats such as vegetable oil o Examples of healthy fats: avocado, olives, virgin olive oil, dark chocolate (?72% Cocoa), nuts (peanuts, almonds, walnuts, cashews, pecans, etc.) . None to Low  Intake of Animal Sources of Protein o Meat sources: chicken, turkey, salmon, tuna. Limit to 4 ounces of meat at one time. o Consider limiting dairy sources, but when choosing dairy focus on: PLAIN Greek yogurt, cottage cheese, high-protein milk . Fruit o Choose berries  When to Eat . Intermittent Fasting: o Choosing not to eat for a specific time period, but DO FOCUS ON HYDRATION when fasting o Multiple Techniques: - Time Restricted Eating: eat 3 meals in a day, each meal lasting no more than 60 minutes, no snacks between meals - 16-18 hour fast: fast for 16 to 18 hours up to 7 days a week. Often suggested to start with 2-3 nonconsecutive days per week.  . Remember the time you sleep is counted as fasting.  . Examples of eating schedule: Fast from 7:00pm-11:00am. Eat between 11:00am-7:00pm.  - 24-hour fast: fast for 24 hours up to every other day. Often suggested to start with 1 day per week . Remember the time you sleep is counted as fasting . Examples of eating schedule:  o Eating day: eat 2-3 meals on your eating day. If doing 2 meals, each meal should last no more than 90 minutes. If doing 3 meals, each meal should last no more than 60 minutes. Finish last meal by 7:00pm. o Fasting day: Fast until 7:00pm.  o IF YOU FEEL UNWELL FOR ANY REASON/IN ANY WAY WHEN FASTING, STOP FASTING BY EATING A NUTRITIOUS SNACK OR LIGHT MEAL o ALWAYS FOCUS ON HYDRATION DURING FASTS - Acceptable Hydration sources: water, broths, tea/coffee (black tea/coffee is best but using a small amount of whole-fat dairy products in coffee/tea is acceptable).  -   Poor Hydration Sources: anything with sugar or artificial sweeteners added to it  These recommendations have been developed for patients that are actively receiving medical care from either Dr. Aryan Bello or Sarah Gray, DNP, NP-C at [facility]. These recommendations are developed for patients with specific medical conditions and are not meant to be  distributed or used by others that are not actively receiving care from either provider listed above at [name]. It is not appropriate to participate in the above eating plans without proper medical supervision.   Reference: Fung, J. The obesity code. [place]: [place]; 2016.   VITAMIN D3 10,000 UNITS/DAY 

## 2020-02-17 ENCOUNTER — Ambulatory Visit (INDEPENDENT_AMBULATORY_CARE_PROVIDER_SITE_OTHER): Payer: 59 | Admitting: Internal Medicine

## 2020-02-24 ENCOUNTER — Inpatient Hospital Stay (HOSPITAL_COMMUNITY): Payer: 59

## 2020-02-24 ENCOUNTER — Encounter (HOSPITAL_COMMUNITY): Payer: Self-pay | Admitting: Obstetrics and Gynecology

## 2020-02-24 ENCOUNTER — Other Ambulatory Visit: Payer: Self-pay

## 2020-02-24 ENCOUNTER — Inpatient Hospital Stay (HOSPITAL_COMMUNITY)
Admission: AD | Admit: 2020-02-24 | Discharge: 2020-02-24 | Disposition: A | Discharge status: Home / Self Care | Payer: 59 | Attending: Obstetrics and Gynecology | Admitting: Obstetrics and Gynecology

## 2020-02-24 DIAGNOSIS — O4691 Antepartum hemorrhage, unspecified, first trimester: Secondary | ICD-10-CM

## 2020-02-24 DIAGNOSIS — O209 Hemorrhage in early pregnancy, unspecified: Secondary | ICD-10-CM | POA: Insufficient documentation

## 2020-02-24 DIAGNOSIS — B373 Candidiasis of vulva and vagina: Secondary | ICD-10-CM | POA: Insufficient documentation

## 2020-02-24 DIAGNOSIS — Z3A01 Less than 8 weeks gestation of pregnancy: Secondary | ICD-10-CM | POA: Diagnosis not present

## 2020-02-24 DIAGNOSIS — O4690 Antepartum hemorrhage, unspecified, unspecified trimester: Secondary | ICD-10-CM

## 2020-02-24 DIAGNOSIS — Z6790 Unspecified blood type, Rh positive: Secondary | ICD-10-CM

## 2020-02-24 DIAGNOSIS — O98811 Other maternal infectious and parasitic diseases complicating pregnancy, first trimester: Secondary | ICD-10-CM | POA: Insufficient documentation

## 2020-02-24 DIAGNOSIS — Z3490 Encounter for supervision of normal pregnancy, unspecified, unspecified trimester: Secondary | ICD-10-CM

## 2020-02-24 DIAGNOSIS — B3731 Acute candidiasis of vulva and vagina: Secondary | ICD-10-CM

## 2020-02-24 LAB — CBC
HCT: 40.8 % (ref 36.0–46.0)
Hemoglobin: 13.4 g/dL (ref 12.0–15.0)
MCH: 31.4 pg (ref 26.0–34.0)
MCHC: 32.8 g/dL (ref 30.0–36.0)
MCV: 95.6 fL (ref 80.0–100.0)
Platelets: 222 10*3/uL (ref 150–400)
RBC: 4.27 MIL/uL (ref 3.87–5.11)
RDW: 12.4 % (ref 11.5–15.5)
WBC: 7.2 10*3/uL (ref 4.0–10.5)
nRBC: 0 % (ref 0.0–0.2)

## 2020-02-24 LAB — COMPREHENSIVE METABOLIC PANEL
ALT: 36 U/L (ref 0–44)
AST: 23 U/L (ref 15–41)
Albumin: 3.7 g/dL (ref 3.5–5.0)
Alkaline Phosphatase: 75 U/L (ref 38–126)
Anion gap: 7 (ref 5–15)
BUN: 10 mg/dL (ref 6–20)
CO2: 25 mmol/L (ref 22–32)
Calcium: 9 mg/dL (ref 8.9–10.3)
Chloride: 106 mmol/L (ref 98–111)
Creatinine, Ser: 0.82 mg/dL (ref 0.44–1.00)
GFR, Estimated: >60 mL/min (ref >60)
Glucose, Bld: 97 mg/dL (ref 70–99)
Potassium: 4 mmol/L (ref 3.5–5.1)
Sodium: 138 mmol/L (ref 135–145)
Total Bilirubin: 0.7 mg/dL (ref 0.3–1.2)
Total Protein: 7 g/dL (ref 6.5–8.1)

## 2020-02-24 LAB — URINALYSIS, ROUTINE W REFLEX MICROSCOPIC
Bilirubin Urine: NEGATIVE
Glucose, UA: NEGATIVE mg/dL
Ketones, ur: NEGATIVE mg/dL
Nitrite: NEGATIVE
Protein, ur: 30 mg/dL — AB
Specific Gravity, Urine: 1.019 (ref 1.005–1.030)
pH: 6 (ref 5.0–8.0)

## 2020-02-24 LAB — ABO/RH: ABO/RH(D): O POS

## 2020-02-24 LAB — POCT PREGNANCY, URINE: Preg Test, Ur: POSITIVE — AB

## 2020-02-24 LAB — HCG, QUANTITATIVE, PREGNANCY: hCG, Beta Chain, Quant, S: 6920 m[IU]/mL — ABNORMAL HIGH (ref <5)

## 2020-02-24 LAB — WET PREP, GENITAL
Sperm: NONE SEEN
Trich, Wet Prep: NONE SEEN

## 2020-02-24 MED ORDER — TERCONAZOLE 0.4 % VA CREA: 1.0000 | TOPICAL_CREAM | Freq: Every day | VAGINAL | 0 refills | Status: AC

## 2020-02-24 NOTE — Discharge Instructions (Signed)
Vaginal Bleeding During Pregnancy, First Trimester  A small amount of bleeding from the vagina (spotting) is relatively common during early pregnancy. It usually stops on its own. Various things may cause bleeding or spotting during early pregnancy. Some bleeding may be related to the pregnancy, and some may not. In many cases, the bleeding is normal and is not a problem. However, bleeding can also be a sign of something serious. Be sure to tell your health care provider about any vaginal bleeding right away. Some possible causes of vaginal bleeding during the first trimester include:  Infection or inflammation of the cervix.  Growths (polyps) on the cervix.  Miscarriage or threatened miscarriage.  Pregnancy tissue developing outside of the uterus (ectopic pregnancy).  A mass of tissue developing in the uterus due to an egg being fertilized incorrectly (molar pregnancy). Follow these instructions at home: Activity  Follow instructions from your health care provider about limiting your activity. Ask what activities are safe for you.  If needed, make plans for someone to help with your regular activities.  Do not have sex or orgasms until your health care provider says that this is safe. General instructions  Take over-the-counter and prescription medicines only as told by your health care provider.  Pay attention to any changes in your symptoms.  Do not use tampons or douche.  Write down how many pads you use each day, how often you change pads, and how soaked (saturated) they are.  If you pass any tissue from your vagina, save the tissue so you can show it to your health care provider.  Keep all follow-up visits as told by your health care provider. This is important. Contact a health care provider if:  You have vaginal bleeding during any part of your pregnancy.  You have cramps or labor pains.  You have a fever. Get help right away if:  You have severe cramps in your  back or abdomen.  You pass large clots or a large amount of tissue from your vagina.  Your bleeding increases.  You feel light-headed or weak, or you faint.  You have chills.  You are leaking fluid or have a gush of fluid from your vagina. Summary  A small amount of bleeding (spotting) from the vagina is relatively common during early pregnancy.  Various things may cause bleeding or spotting in early pregnancy.  Be sure to tell your health care provider about any vaginal bleeding right away. This information is not intended to replace advice given to you by your health care provider. Make sure you discuss any questions you have with your health care provider. Document Revised: 08/19/2018 Document Reviewed: 08/02/2016 Elsevier Patient Education  [place] of Pregnancy The first trimester of pregnancy is from week 1 until the end of week 13 (months 1 through 3). A week after a sperm fertilizes an egg, the egg will implant on the wall of the uterus. This embryo will begin to develop into a baby. Genes from you and your partner will form the baby. The female genes will determine whether the baby will be a boy or a girl. At 6-8 weeks, the eyes and face will be formed, and the heartbeat can be seen on ultrasound. At the end of 12 weeks, all the baby's organs will be formed. Now that you are pregnant, you will want to do everything you can to have a healthy baby. Two of the most important things  are to get good prenatal care and to follow your health care provider's instructions. Prenatal care is all the medical care you receive before the baby's birth. This care will help prevent, find, and treat any problems during the pregnancy and childbirth. Body changes during your first trimester Your body goes through many changes during pregnancy. The changes vary from woman to woman.  You may gain or lose a couple of pounds at first.  You may feel sick to your  stomach (nauseous) and you may throw up (vomit). If the vomiting is uncontrollable, call your health care provider.  You may tire easily.  You may develop headaches that can be relieved by medicines. All medicines should be approved by your health care provider.  You may urinate more often. Painful urination may mean you have a bladder infection.  You may develop heartburn as a result of your pregnancy.  You may develop constipation because certain hormones are causing the muscles that push stool through your intestines to slow down.  You may develop hemorrhoids or swollen veins (varicose veins).  Your breasts may begin to grow larger and become tender. Your nipples may stick out more, and the tissue that surrounds them (areola) may become darker.  Your gums may bleed and may be sensitive to brushing and flossing.  Dark spots or blotches (chloasma, mask of pregnancy) may develop on your face. This will likely fade after the baby is born.  Your menstrual periods will stop.  You may have a loss of appetite.  You may develop cravings for certain kinds of food.  You may have changes in your emotions from day to day, such as being excited to be pregnant or being concerned that something may go wrong with the pregnancy and baby.  You may have more vivid and strange dreams.  You may have changes in your hair. These can include thickening of your hair, rapid growth, and changes in texture. Some women also have hair loss during or after pregnancy, or hair that feels dry or thin. Your hair will most likely return to normal after your baby is born. What to expect at prenatal visits During a routine prenatal visit:  You will be weighed to make sure you and the baby are growing normally.  Your blood pressure will be taken.  Your abdomen will be measured to track your baby's growth.  The fetal heartbeat will be listened to between weeks 10 and 14 of your pregnancy.  Test results from any  previous visits will be discussed. Your health care provider may ask you:  How you are feeling.  If you are feeling the baby move.  If you have had any abnormal symptoms, such as leaking fluid, bleeding, severe headaches, or abdominal cramping.  If you are using any tobacco products, including cigarettes, chewing tobacco, and electronic cigarettes.  If you have any questions. Other tests that may be performed during your first trimester include:  Blood tests to find your blood type and to check for the presence of any previous infections. The tests will also be used to check for low iron levels (anemia) and protein on red blood cells (Rh antibodies). Depending on your risk factors, or if you previously had diabetes during pregnancy, you may have tests to check for high blood sugar that affects pregnant women (gestational diabetes).  Urine tests to check for infections, diabetes, or protein in the urine.  An ultrasound to confirm the proper growth and development of the baby.  Fetal  screens for spinal cord problems (spina bifida) and Down syndrome.  HIV (human immunodeficiency virus) testing. Routine prenatal testing includes screening for HIV, unless you choose not to have this test.  You may need other tests to make sure you and the baby are doing well. Follow these instructions at home: Medicines  Follow your health care provider's instructions regarding medicine use. Specific medicines may be either safe or unsafe to take during pregnancy.  Take a prenatal vitamin that contains at least 600 micrograms (mcg) of folic acid.  If you develop constipation, try taking a stool softener if your health care provider approves. Eating and drinking   Eat a balanced diet that includes fresh fruits and vegetables, whole grains, good sources of protein such as meat, eggs, or tofu, and low-fat dairy. Your health care provider will help you determine the amount of weight gain that is right for  you.  Avoid raw meat and uncooked cheese. These carry germs that can cause birth defects in the baby.  Eating four or five small meals rather than three large meals a day may help relieve nausea and vomiting. If you start to feel nauseous, eating a few soda crackers can be helpful. Drinking liquids between meals, instead of during meals, also seems to help ease nausea and vomiting.  Limit foods that are high in fat and processed sugars, such as fried and sweet foods.  To prevent constipation: ? Eat foods that are high in fiber, such as fresh fruits and vegetables, whole grains, and beans. ? Drink enough fluid to keep your urine clear or pale yellow. Activity  Exercise only as directed by your health care provider. Most women can continue their usual exercise routine during pregnancy. Try to exercise for 30 minutes at least 5 days a week. Exercising will help you: ? Control your weight. ? Stay in shape. ? Be prepared for labor and delivery.  Experiencing pain or cramping in the lower abdomen or lower back is a good sign that you should stop exercising. Check with your health care provider before continuing with normal exercises.  Try to avoid standing for long periods of time. Move your legs often if you must stand in one place for a long time.  Avoid heavy lifting.  Wear low-heeled shoes and practice good posture.  You may continue to have sex unless your health care provider tells you not to. Relieving pain and discomfort  Wear a good support bra to relieve breast tenderness.  Take warm sitz baths to soothe any pain or discomfort caused by hemorrhoids. Use hemorrhoid cream if your health care provider approves.  Rest with your legs elevated if you have leg cramps or low back pain.  If you develop varicose veins in your legs, wear support hose. Elevate your feet for 15 minutes, 3-4 times a day. Limit salt in your diet. Prenatal care  Schedule your prenatal visits by the twelfth  week of pregnancy. They are usually scheduled monthly at first, then more often in the last 2 months before delivery.  Write down your questions. Take them to your prenatal visits.  Keep all your prenatal visits as told by your health care provider. This is important. Safety  Wear your seat belt at all times when driving.  Make a list of emergency phone numbers, including numbers for family, friends, the hospital, and police and fire departments. General instructions  Ask your health care provider for a referral to a local prenatal education class. Begin classes no later  than the beginning of month 6 of your pregnancy.  Ask for help if you have counseling or nutritional needs during pregnancy. Your health care provider can offer advice or refer you to specialists for help with various needs.  Do not use hot tubs, steam rooms, or saunas.  Do not douche or use tampons or scented sanitary pads.  Do not cross your legs for long periods of time.  Avoid cat litter boxes and soil used by cats. These carry germs that can cause birth defects in the baby and possibly loss of the fetus by miscarriage or stillbirth.  Avoid all smoking, herbs, alcohol, and medicines not prescribed by your health care provider. Chemicals in these products affect the formation and growth of the baby.  Do not use any products that contain nicotine or tobacco, such as cigarettes and e-cigarettes. If you need help quitting, ask your health care provider. You may receive counseling support and other resources to help you quit.  Schedule a dentist appointment. At home, brush your teeth with a soft toothbrush and be gentle when you floss. Contact a health care provider if:  You have dizziness.  You have mild pelvic cramps, pelvic pressure, or nagging pain in the abdominal area.  You have persistent nausea, vomiting, or diarrhea.  You have a bad smelling vaginal discharge.  You have pain when you urinate.  You  notice increased swelling in your face, hands, legs, or ankles.  You are exposed to fifth disease or chickenpox.  You are exposed to [place] measles (rubella) and have never had it. Get help right away if:  You have a fever.  You are leaking fluid from your vagina.  You have spotting or bleeding from your vagina.  You have severe abdominal cramping or pain.  You have rapid weight gain or loss.  You vomit blood or material that looks like coffee grounds.  You develop a severe headache.  You have shortness of breath.  You have any kind of trauma, such as from a fall or a car accident. Summary  The first trimester of pregnancy is from week 1 until the end of week 13 (months 1 through 3).  Your body goes through many changes during pregnancy. The changes vary from woman to woman.  You will have routine prenatal visits. During those visits, your health care provider will examine you, discuss any test results you may have, and talk with you about how you are feeling. This information is not intended to replace advice given to you by your health care provider. Make sure you discuss any questions you have with your health care provider. Document Revised: 04/12/2017 Document Reviewed: 04/11/2016 Elsevier Patient Education  [place] Medications in Pregnancy    Acne: Benzoyl Peroxide Salicylic Acid  Backache/Headache: Tylenol: 2 regular strength every 4 hours OR              2 Extra strength every 6 hours  Colds/Coughs/Allergies: Benadryl (alcohol free) 25 mg every 6 hours as needed Breath right strips Claritin Cepacol throat lozenges Chloraseptic throat spray Cold-Eeze- up to three times per day Cough drops, alcohol free Flonase (by prescription only) Guaifenesin Mucinex Robitussin DM (plain only, alcohol free) Saline nasal spray/drops Sudafed (pseudoephedrine) & Actifed ** use only after [redacted] weeks gestation and if you do not  have high blood pressure Tylenol Vicks Vaporub  Zinc lozenges Zyrtec   Constipation: Colace Ducolax suppositories Fleet enema Glycerin suppositories Metamucil Milk of magnesia Miralax Senokot Smooth move tea  Diarrhea: Kaopectate Imodium A-D  *NO pepto Bismol  Hemorrhoids: Anusol Anusol HC Preparation H Tucks  Indigestion: Tums Maalox Mylanta Zantac  Pepcid  Insomnia: Benadryl (alcohol free) 25mg  every 6 hours as needed Tylenol PM Unisom, no Gelcaps  Leg Cramps: Tums MagGel  Nausea/Vomiting:  Bonine Dramamine Emetrol Ginger extract Sea bands Meclizine  Nausea medication to take during pregnancy:  Unisom (doxylamine succinate 25 mg tablets) Take one tablet daily at bedtime. If symptoms are not adequately controlled, the dose can be increased to a maximum recommended dose of two tablets daily (1/2 tablet in the morning, 1/2 tablet mid-afternoon and one at bedtime). Vitamin B6 100mg  tablets. Take one tablet twice a day (up to 200 mg per day).  Skin Rashes: Aveeno products Benadryl cream or 25mg  every 6 hours as needed Calamine Lotion 1% cortisone cream  Yeast infection: Gyne-lotrimin 7 Monistat 7   **If taking multiple medications, please check labels to avoid duplicating the same active ingredients **take medication as directed on the label ** Do not exceed 4000 mg of tylenol in 24 hours **Do not take medications that contain aspirin or ibuprofen            Abdominal Pain During Pregnancy  Abdominal pain is common during pregnancy, and has many possible causes. Some causes are more serious than others, and sometimes the cause is not known. Abdominal pain can be a sign that labor is starting. It can also be caused by normal growth and stretching of muscles and ligaments during pregnancy. Always tell your health care provider if you have any abdominal pain. Follow these instructions at home:  Do not have sex or put anything in your  vagina until your pain goes away completely.  Get plenty of rest until your pain improves.  Drink enough fluid to keep your urine pale yellow.  Take over-the-counter and prescription medicines only as told by your health care provider.  Keep all follow-up visits as told by your health care provider. This is important. Contact a health care provider if:  Your pain continues or gets worse after resting.  You have lower abdominal pain that: ? Comes and goes at regular intervals. ? Spreads to your back. ? Is similar to menstrual cramps.  You have pain or burning when you urinate. Get help right away if:  You have a fever or chills.  You have vaginal bleeding.  You are leaking fluid from your vagina.  You are passing tissue from your vagina.  You have vomiting or diarrhea that lasts for more than 24 hours.  Your baby is moving less than usual.  You feel very weak or faint.  You have shortness of breath.  You develop severe pain in your upper abdomen. Summary  Abdominal pain is common during pregnancy, and has many possible causes.  If you experience abdominal pain during pregnancy, tell your health care provider right away.  Follow your health care provider's home care instructions and keep all follow-up visits as directed. This information is not intended to replace advice given to you by your health care provider. Make sure you discuss any questions you have with your health care provider. Document Revised: 08/18/2018 Document Reviewed: 08/02/2016 Elsevier Patient Education  [place].

## 2020-02-24 NOTE — [facility] Note (Signed)
. °  Cathy [name] is a 44 y.o. at [redacted]w[redacted]d here in [facility] reporting: she had intercourse on Saturday night and has had dark red vaginal bleeding on and off at times it looks milky pink. Lower back discomfort.  LMP: 12/31/19 Onset of complaint: Sunday Pain score: 4 Vitals:   02/24/20 1025  BP: 138/76  Pulse: 85  Resp: 16  Temp: 98.3 F (36.8 C)  SpO2: 99%     FHT: Lab orders placed from triage: UA/UPT

## 2020-02-24 NOTE — MAU Provider Note (Signed)
History     CSN: [id number]  Arrival date and time: 02/24/20 1004   First Provider Initiated Contact with Patient 02/24/20 1210      Chief Complaint  Patient presents with  . Vaginal Bleeding  . Possible Pregnancy   Ms. GYSELLE MATTHEW is a 44 y.o. [phone] at [redacted]w[redacted]d who presents to MAU for vaginal bleeding which began Sunday after the patient had intercourse. Patient reports she will see either milky red discharge or deep red discharge on the toilet tissue after wiping about twice a day. Patient reports she has never seem the blood in her underwear or needed to wear a pad.  Passing blood clots? no Blood soaking clothes? no Lightheaded/dizzy? no Significant pelvic pain or cramping? no Passed any tissue? no  Current pregnancy problems? Pt has not yet been seen Blood Type? O Positive Allergies? Perfumed soaps/lotions, sudafed, adhesive, sulfa Current medications? PNVs, Thyroid NP Current PNC & next appt? Pt will call [facility] to schedule appt  Pt denies vaginal discharge/odor/itching. Pt denies N/V, abdominal pain, constipation, diarrhea, or urinary problems. Pt denies fever, chills, fatigue, sweating or changes in appetite. Pt denies SOB or chest pain. Pt denies dizziness, HA, light-headedness, weakness.   OB History    Gravida  5   Para  1   Term  1   Preterm  0   AB  3   Living  1     SAB  3   TAB  0   Ectopic  0   Multiple  0   Live Births  1           Past Medical History:  Diagnosis Date  . Missed ab   . Seasonal allergies     Past Surgical History:  Procedure Laterality Date  . CARPAL TUNNEL RELEASE  04/09/2012   Procedure: CARPAL TUNNEL RELEASE;  Surgeon: [name], MD;  Location: [facility];  Service: Orthopedics;  Laterality: Right;  . CESAREAN SECTION  12/17/2011   Procedure: CESAREAN SECTION; x1  Surgeon: [name], MD;  Location: [facility];  Service: Gynecology;  Laterality: N/A;  . CHOLECYSTECTOMY   11/06/2004  . DILATION AND EVACUATION N/A 08/17/2014   Procedure: DILATATION AND EVACUATION;  Surgeon: Arvella Nigh, MD;  Location: [facility];  Service: Gynecology;  Laterality: N/A;  . DILATION AND EVACUATION N/A 03/07/2015   Procedure: DILATATION AND EVACUATION;  Surgeon: Arvella Nigh, MD;  Location: [facility];  Service: Gynecology;  Laterality: N/A;  nonviable at 8 weeks O positive  . DILATION AND EVACUATION N/A 05/04/2016   Procedure: DILATATION AND EVACUATION;  Surgeon: Arvella Nigh, MD;  Location: [facility];  Service: Gynecology;  Laterality: N/A;  . DORSAL COMPARTMENT RELEASE  04/09/2012   Procedure: RELEASE DORSAL COMPARTMENT (DEQUERVAIN);  Surgeon: [name], MD;  Location: [facility];  Service: Orthopedics;  Laterality: Right;  RELEASE 1ST DORSAL RIGHT  . LAPAROSCOPIC ENDOMETRIOSIS FULGURATION  1996  . NASAL SEPTOPLASTY W/ TURBINOPLASTY Bilateral 12/24/2017   Procedure: NASAL SEPTOPLASTY WITH TURBINATE REDUCTION;  Surgeon: Leta Baptist, MD;  Location: [facility];  Service: ENT;  Laterality: Bilateral;    Family History  Problem Relation Age of Onset  . Colon cancer Mother 74  . Heart failure Father   . Hypertension Father   . Heart disease Father   . Pancreatic cancer Maternal Grandmother   . Colon cancer Paternal Grandmother   . Prostate cancer Maternal Grandfather   . Pancreatitis  Neg Hx     Social History   Tobacco Use  . Smoking status: Never Smoker  . Smokeless tobacco: Never Used  Substance Use Topics  . Alcohol use: Yes    Alcohol/week: 2.0 standard drinks    Types: 2 Cans of beer per week    Comment: occasional  . Drug use: No    Allergies:  Allergies  Allergen Reactions  . Other Other (See Comments) and Rash    PERFUMED SOAPS/LOTIONS - IS BOTHERED BY FRAGRANCES Gives pt headaches  . Sudafed [Pseudoephedrine] Other (See Comments)    Makes pt delirious   . Adhesive [Tape] Rash  . Sulfa Antibiotics Nausea And Vomiting     No medications prior to admission.    Review of Systems  Constitutional: Negative for chills, diaphoresis, fatigue and fever.  Eyes: Negative for visual disturbance.  Respiratory: Negative for shortness of breath.   Cardiovascular: Negative for chest pain.  Gastrointestinal: Negative for abdominal pain, constipation, diarrhea, nausea and vomiting.  Genitourinary: Positive for vaginal bleeding. Negative for dysuria, flank pain, frequency, pelvic pain, urgency and vaginal discharge.  Neurological: Negative for dizziness, weakness, light-headedness and headaches.   Physical Exam   Blood pressure (!) 138/59, pulse 85, temperature 98.3 F (36.8 C), temperature source Oral, resp. rate 18, height 5\' 3"  (1.6 m), weight 81.6 kg, last menstrual period 12/31/2019, SpO2 100 %.  Patient Vitals for the past 24 hrs:  BP Temp Temp src Pulse Resp SpO2 Height Weight  02/24/20 1324 (!) 138/59 98.3 F (36.8 C) Oral 85 18 100 % -- --  02/24/20 1320 (!) 146/73 -- -- 92 -- -- -- --  02/24/20 1025 138/76 98.3 F (36.8 C) -- 85 16 99 % 5\' 3"  (1.6 m) 81.6 kg   Physical Exam Vitals and nursing note reviewed.  Constitutional:      General: She is not in acute distress.    Appearance: Normal appearance. She is not ill-appearing, toxic-appearing or diaphoretic.  HENT:     Head: Normocephalic and atraumatic.  Pulmonary:     Effort: Pulmonary effort is normal.  Neurological:     Mental Status: She is alert and oriented to person, place, and time.  Psychiatric:        Mood and Affect: Mood normal.        Behavior: Behavior normal.        Thought Content: Thought content normal.        Judgment: Judgment normal.    Results for orders placed or performed during the hospital encounter of 02/24/20 (from the past 24 hour(s))  Pregnancy, urine POC     Status: Abnormal   Collection Time: 02/24/20 10:21 AM  Result Value Ref Range   Preg Test, Ur POSITIVE (A) NEGATIVE  Urinalysis, Routine w reflex  microscopic Urine, Clean Catch     Status: Abnormal   Collection Time: 02/24/20 10:24 AM  Result Value Ref Range   Color, Urine YELLOW YELLOW   APPearance CLOUDY (A) CLEAR   Specific Gravity, Urine 1.019 1.005 - 1.030   pH 6.0 5.0 - 8.0   Glucose, UA NEGATIVE NEGATIVE mg/dL   Hgb urine dipstick LARGE (A) NEGATIVE   Bilirubin Urine NEGATIVE NEGATIVE   Ketones, ur NEGATIVE NEGATIVE mg/dL   Protein, ur 30 (A) NEGATIVE mg/dL   Nitrite NEGATIVE NEGATIVE   Leukocytes,Ua LARGE (A) NEGATIVE   RBC / HPF 11-20 0 - 5 RBC/hpf   WBC, UA 21-50 0 - 5 WBC/hpf   Bacteria, UA FEW (A)  NONE SEEN   Squamous Epithelial / LPF 21-50 0 - 5   Mucus PRESENT    Budding Yeast PRESENT   CBC     Status: None   Collection Time: 02/24/20 12:02 PM  Result Value Ref Range   WBC 7.2 4.0 - 10.5 K/uL   RBC 4.27 3.87 - 5.11 MIL/uL   Hemoglobin 13.4 12.0 - 15.0 g/dL   HCT 40.8 36 - 46 %   MCV 95.6 80.0 - 100.0 fL   MCH 31.4 26.0 - 34.0 pg   MCHC 32.8 30.0 - 36.0 g/dL   RDW 12.4 11.5 - 15.5 %   Platelets 222 150 - 400 K/uL   nRBC 0.0 0.0 - 0.2 %  Comprehensive metabolic panel     Status: None   Collection Time: 02/24/20 12:02 PM  Result Value Ref Range   Sodium 138 135 - 145 mmol/L   Potassium 4.0 3.5 - 5.1 mmol/L   Chloride 106 98 - 111 mmol/L   CO2 25 22 - 32 mmol/L   Glucose, Bld 97 70 - 99 mg/dL   BUN 10 6 - 20 mg/dL   Creatinine, Ser 0.82 0.44 - 1.00 mg/dL   Calcium 9.0 8.9 - 10.3 mg/dL   Total Protein 7.0 6.5 - 8.1 g/dL   Albumin 3.7 3.5 - 5.0 g/dL   AST 23 15 - 41 U/L   ALT 36 0 - 44 U/L   Alkaline Phosphatase 75 38 - 126 U/L   Total Bilirubin 0.7 0.3 - 1.2 mg/dL   GFR, Estimated >60 >60 mL/min   Anion gap 7 5 - 15  hCG, quantitative, pregnancy     Status: Abnormal   Collection Time: 02/24/20 12:02 PM  Result Value Ref Range   hCG, Beta Chain, Quant, S 6,920 (H) <5 mIU/mL  ABO/Rh     Status: None   Collection Time: 02/24/20 12:02 PM  Result Value Ref Range   ABO/RH(D) O POS    No rh immune  globuloin      NOT A RH IMMUNE GLOBULIN CANDIDATE, PT RH POSITIVE Performed at [facility], [address] [place], [place] [address]   Wet prep, genital     Status: Abnormal   Collection Time: 02/24/20 12:12 PM  Result Value Ref Range   Yeast Wet Prep HPF POC PRESENT (A) NONE SEEN   Trich, Wet Prep NONE SEEN NONE SEEN   Clue Cells Wet Prep HPF POC PRESENT (A) NONE SEEN   WBC, Wet Prep HPF POC MANY (A) NONE SEEN   Sperm NONE SEEN    US OB LESS THAN 14 WEEKS WITH OB TRANSVAGINAL  Result Date: 02/24/2020 CLINICAL DATA:  44 year old pregnant female presents with vaginal bleeding. Quantitative beta HCG pending. EDC by LMP: 10/06/2020, projecting to an expected gestational age of [redacted] weeks 6 days. EXAM: OBSTETRIC <14 WK [place] AND TRANSVAGINAL OB US TECHNIQUE: Both transabdominal and transvaginal ultrasound examinations were performed for complete evaluation of the gestation as well as the maternal uterus, adnexal regions, and pelvic cul-de-sac. Transvaginal technique was performed to assess early pregnancy. COMPARISON:  No prior scans from this gestation. FINDINGS: Intrauterine gestational sac: Single Yolk sac:  Visualized. Embryo:  Visualized. Cardiac Activity: Visualized. Heart Rate: 150 bpm CRL:  5.3 mm   6 w   1 d                  [place] EDC: 10/18/2020 Subchorionic hemorrhage:  None visualized. Maternal uterus/adnexae: Anteverted uterus with no uterine  fibroids. Nonvisualization of the ovaries bilaterally. No adnexal masses. No abnormal free fluid in the pelvis. IMPRESSION: 1. Single living intrauterine gestation at 6 weeks 1 day by crown-rump length, discordant with the expected gestational age of [redacted] weeks 6 days by provided menstrual dating. Normal embryonic cardiac activity. Suggest follow-up obstetric scan in 1 month to assess for appropriate fetal growth. 2. Nonvisualization of the ovaries bilaterally.  No adnexal masses. Electronically Signed   By: Ilona Sorrel M.D.   On: 02/24/2020 13:00     MAU Course  Procedures  MDM -r/o ectopic -UA: cloudy/lg hgb/30PRO/lg leuks/few bacteria, sending urine for culture -CBC: WNL -CMP: WNL -[place]: single IUP, +yolk sac, +embryo, FHR 150, [redacted]w[redacted]d -hCG: 6,920 -ABO: O Positive -WetPrep: +yeast -GC/CT collected -pt discharged to home in stable condition  Orders Placed This Encounter  Procedures  . Wet prep, genital    Standing Status:   Standing    Number of Occurrences:   1  . Culture, OB Urine    Standing Status:   Standing    Number of Occurrences:   1  . US OB LESS THAN 14 WEEKS WITH OB TRANSVAGINAL    Standing Status:   Standing    Number of Occurrences:   1    Order Specific Question:   Symptom/Reason for Exam    Answer:   Vaginal bleeding in pregnancy [657846]  . Urinalysis, Routine w reflex microscopic Urine, Clean Catch    Standing Status:   Standing    Number of Occurrences:   1  . CBC    Standing Status:   Standing    Number of Occurrences:   1  . Comprehensive metabolic panel    Standing Status:   Standing    Number of Occurrences:   1  . hCG, quantitative, pregnancy    Standing Status:   Standing    Number of Occurrences:   1  . Pregnancy, urine POC    Standing Status:   Standing    Number of Occurrences:   1  . ABO/Rh    Standing Status:   Standing    Number of Occurrences:   1  . Discharge patient    Order Specific Question:   Discharge disposition    Answer:   01-Home or Self Care [1]    Order Specific Question:   Discharge patient date    Answer:   02/24/2020   Meds ordered this encounter  Medications  . terconazole (TERAZOL 7) 0.4 % vaginal cream    Sig: Place 1 applicator vaginally at bedtime for 7 days.    Dispense:  45 g    Refill:  0    Order Specific Question:   Supervising Provider    Answer:   ERVIN, MICHAEL L [[id number]]    Assessment and Plan   1. Intrauterine pregnancy   2. Vaginal bleeding in pregnancy   3. Vaginal yeast infection   4. Blood type, Rh positive   5. [redacted] weeks gestation  of pregnancy     Allergies as of 02/24/2020      Reactions   Other Other (See Comments), Rash   PERFUMED SOAPS/LOTIONS - IS BOTHERED BY FRAGRANCES Gives pt headaches   Sudafed [pseudoephedrine] Other (See Comments)   Makes pt delirious    Adhesive [tape] Rash   Sulfa Antibiotics Nausea And Vomiting      Medication List    TAKE these medications   loratadine 10 MG tablet Commonly known as: CLARITIN Take 10 mg  by mouth daily.   NP Thyroid 30 MG tablet Generic drug: thyroid Take 1 tablet (30 mg total) by mouth daily before breakfast.   omeprazole 10 MG capsule Commonly known as: PRILOSEC Take 20 mg by mouth daily.   predniSONE 10 MG (21) Tbpk tablet Commonly known as: STERAPRED UNI-PAK 21 TAB Take 6 tabs by mouth daily  for 1 days, then 5 tabs for 1 days, then 4 tabs for 1 days, then 3 tabs for 1 days, 2 tabs for 1 days, then 1 tab by mouth daily for 1days   prenatal multivitamin Tabs tablet Take 1 tablet by mouth daily at 12 noon.   terconazole 0.4 % vaginal cream Commonly known as: TERAZOL 7 Place 1 applicator vaginally at bedtime for 7 days.   valACYclovir 1000 MG tablet Commonly known as: VALTREX Take 1 tablet (1,000 mg total) by mouth 3 (three) times daily.       -will call with culture results, if positive -discussed possible causes of bleeding in early pregnancy -start [facility] -safe meds in pregnancy list given -return MAU precautions given -pt discharged to home in stable condition  Elmyra Ricks E Tamyra Fojtik 02/24/2020, 2:11 PM

## 2020-02-25 LAB — CULTURE, OB URINE: Culture: 50000 — AB

## 2020-02-25 LAB — GC/CHLAMYDIA PROBE AMP (~~LOC~~) NOT AT ARMC
Chlamydia: NEGATIVE
Comment: NEGATIVE
Comment: NORMAL
Neisseria Gonorrhea: NEGATIVE

## 2020-03-01 ENCOUNTER — Ambulatory Visit (INDEPENDENT_AMBULATORY_CARE_PROVIDER_SITE_OTHER): Payer: 59 | Admitting: Internal Medicine

## 2020-04-04 ENCOUNTER — Ambulatory Visit (INDEPENDENT_AMBULATORY_CARE_PROVIDER_SITE_OTHER): Payer: 59 | Admitting: Internal Medicine

## 2021-03-22 ENCOUNTER — Other Ambulatory Visit: Payer: Self-pay | Admitting: Obstetrics and Gynecology

## 2021-04-02 NOTE — H&P (Addendum)
Cathy [name] is a 45 y.o. female P: 1-0-4-1,  who presents for hysterectomy because of chronic pelvic pain, endometriosis, adenomyosis and uterine fibroids. The patient was diagnosed with endometriosis in 1996 by laparoscopy and has managed related pelvic pain and bleeding with oral contraceptives.  In recent years the patient's 5 day menstrual flow was accompanied by large clots and  pad change 4 times a day.  She also reports menstrual cramping that was rated 10/10 with minimal relief from Ibuprofen. During this season the patient noticed that she kept a yeast infection and found it hard to find relief. She denies any dyspareunia, changes in her bowel or bladder function or intermenstrual bleeding. In November 2021 the patient had a miscarriage and in the 5 months that followed,  experienced menstrual-like pelvic pain almost daily along with lower back pain and right sided sciatic pain. Eventually she was placed on continuous oral contraceptives which made her amenorrheic but had minimal impact on her pain. She was then placed on Lupron Depot with good relief from her pain but expressed awareness of its limited duration of use.  A pelvic ultrasound in 07/29/2020 revealed an  anteverted uterus (78 cc): 8.4 x 3.9 x 4.5 cm with features of adenomyosis; the endometrium measured: 6 mm;  a fibroid in upper uterine segment-12 mm; right ovary-2.4 cm and left ovary- 1.8 cm.   The patient was given a review of both medical and surgical management options for her bleeding and pelvic pain symptoms however,  she has chosen definitive therapy in the form of hysterectomy.   Past Medical History  OB History: G: 5; P: 1-0-4-1; C-section: 2013  GYN History: menarche: 45 YO;   LMP: September 10, 2020 (due to continuous oral contraceptives and :Lupron Depot;   Contraception: Oral Contraception;  Denies history of abnormal PAP smear . Last PAP smear-November 2021-normal with negative HPV  Medical History: Pancreatitis, Uterine  Fibroids, Adenomyosis and Endometriosis   Surgical History: 1996 Laparoscopy (diagnosed with endometriosis); 2006 Cholecystectomy; 2013 C-section and Right Carpal Tunnel Repair; 2019 Nasal Surgery. Denies problems with anesthesia or history of blood transfusions  Family History: Cancers (colon, breast, lung, pancreatic and stomach); Stroke, Lupus, Rheumatoid Arthritis, Hypertension and Thyroid Disease  Social History: Single and employed as an Optometrist; Denies tobacco use and rarely consumes alcohol    Medications: Haley 1.5/30 po daily Ibuprofen 600 mg po pc every 6 hours prn Lupron Depot 11.25 mg IM (given 03/15/2021) Omeprazole 40 mg po daily Tizanidine 4 mg po daily Tobramycin 0.3% eye drops as directed  Allergies  Allergen Reactions   Other Other (See Comments) and Rash    PERFUMED SOAPS/LOTIONS - IS BOTHERED BY FRAGRANCES Gives pt headaches   Sudafed [Pseudoephedrine] Other (See Comments)    Makes pt delirious    Adhesive [Tape] Rash   Sulfa Antibiotics Nausea And Vomiting  [Patient states that adhesives only leave a mark on her skin-no itching or rash]  Denies sensitivity to peanuts, shellfish, soy, latex or adhesives (see previous note)   ROS: Admits to glasses/contact lenses;  Denies headache, vision changes, nasal congestion, dysphagia, tinnitus, dizziness, hoarseness, cough,  chest pain, shortness of breath, nausea, vomiting, diarrhea,constipation,  urinary frequency, urgency  dysuria, hematuria, vaginitis symptoms, pelvic pain, swelling of joints,easy bruising,  myalgias, arthralgias, skin rashes, unexplained weight loss and except as is mentioned in the history of present illness, patient's review of systems is otherwise negative.    Physical Exam  Bp: 130/76;  Temperature: 98.5 degrees F orally; Weight:  193 lbs.; Height: 5'3";  BMI: 34.2  Neck: supple without masses or thyromegaly Lungs: clear to auscultation Heart: regular rate and rhythm Abdomen: soft,   non-tender and no organomegaly Pelvic:EGBUS- wnl; vagina-normal rugae; uterus-normal size, (exam limited by habitus) cervix without lesions or motion tenderness; adnexae-no tenderness or masses Extremities:  no clubbing, cyanosis or edema   Assesment: Endometriosis                      Adenomyosisi                      Uterine Fibroids   Disposition:  The robot-assisted hysterectomy was reviewed with the patient along with the indication for her procedure(s). Benefits of the robotic approach include lesser postoperative pain, less blood loss during surgery, reduced risk of injury to other organs,( due to better visualization with a 3-D HD 10 times magnifying camera),  shorter hospital stay-between 0-1 night(s) and rapid recovery with return to daily routine in 2-3 weeks.  (however, nothing in the vagina for 6 weeks). Although the robot-assisted hysterectomy has a longer operative time than traditional laparotomy, the benefits usually outweigh the risks, in a patient with good medical history,   Risks include bleeding, infection, injury to other organs (bladder more vulnerable if previous cesarean delivery) , need for laparotomy, transient post-operative facial edema, increased risk of pelvic prolapse (associated with any hysterectomy) as well as earlier onset of menopause. Preservation of the ovaries was also reviewed and recommended. Finally, we recommended bilateral salpingectomy for lifelong reduction of ovarian cancer.   The patient's questions were answered and she verbalized understanding of the risks and pre-operative instructions. The patient has consented to proceed with a Robot Assisted Laparoscopic Hysterectomy with Bilateral Salpingectomy at [facility] on April 20, 2021.    CSN# [id number]   [place] J. Florene Glen, PA-C  for Dr. Dede Query. Madalina Rosman

## 2021-04-11 ENCOUNTER — Encounter (HOSPITAL_BASED_OUTPATIENT_CLINIC_OR_DEPARTMENT_OTHER): Payer: Self-pay | Admitting: Obstetrics and Gynecology

## 2021-04-12 ENCOUNTER — Encounter (HOSPITAL_BASED_OUTPATIENT_CLINIC_OR_DEPARTMENT_OTHER): Payer: Self-pay | Admitting: Obstetrics and Gynecology

## 2021-04-12 DIAGNOSIS — Z01812 Encounter for preprocedural laboratory examination: Secondary | ICD-10-CM | POA: Diagnosis present

## 2021-04-12 NOTE — Progress Notes (Signed)
Spoke w/ via phone for pre-op interview---pt Lab needs dos----  lab appt 04-17-2021 845 for cbc bmp t & s             Lab results------none COVID test -----patient states asymptomatic no test needed Arrive at -------930 am 04-20-2021 NPO after MN NO Solid Food.  Clear liquids from MN until---830 am, drink 2 ensure pre surgery drinks @ 1000 pm night before surgery and 1 ensure pre surgery drink  @ 830 am morning of surgery then npo Med rec completed omeprazole Diabetic medication -----n/a Patient instructed no nail polish to be worn day of surgery Patient instructed to bring photo id and insurance card day of surgery Patient aware to have Driver (ride ) / caregiver    for 24 hours after surgery  best friend De Nurse will stay Patient Special Instructions -----pt given overnight stay instructions Pre-Op special Istructions -----none Patient verbalized understanding of instructions that were given at this phone interview. Patient denies shortness of breath, chest pain, fever, cough at this phone interview.

## 2021-04-12 NOTE — Progress Notes (Signed)
PLEASE WEAR A MASK OUT IN PUBLIC AND SOCIAL DISTANCE AND [place] YOUR HANDS FREQUENTLY. PLEASE ASK ALL YOUR CLOSE HOUSEHOLD CONTACTS TO WEAR MASK OUT IN PUBLIC AND SOCIAL DISTANCE AND [place] HANDS FREQUENTLY ALSO.      Your procedure is scheduled on 04-20-2021  Report to [facility].   Call this number if you have problems the morning of surgery  [phone].   OUR ADDRESS IS [place].  WE ARE LOCATED IN [facility].  PLEASE BRING YOUR INSURANCE CARD AND PHOTO ID DAY OF SURGERY.  ONLY ONE PERSON ALLOWED IN FACILITY WAITING AREA.                                     REMEMBER:  DO NOT EAT FOOD, CANDY GUM OR MINTS  AFTER MIDNIGHT . DRINK 2 ENSURE PRE SURGERY DRINKS AT 1000 PM THE NIGHT BEFORE YOUR SURGERY . ON THE MORNING OF YOUR SURGERY  PLEASE DRINK 1 ENSURE PRE SURGERY  DRINK AT 830 AM THEN NOTHING BY MOUTH.  YOU MAY  BRUSH YOUR TEETH MORNING OF SURGERY AND RINSE YOUR MOUTH OUT, NO CHEWING GUM CANDY OR MINTS.    CLEAR LIQUID DIET   Foods Allowed                                                                     Foods Excluded  Coffee and tea, regular and decaf                             liquids that you cannot  Plain Jell-O any favor except red or purple                                           see through such as: Fruit ices (not with fruit pulp)                                     milk, soups, orange juice  Iced Popsicles                                    All solid food Carbonated beverages, regular and diet                                    Cranberry, grape and apple juices Sports drinks like Gatorade Lightly seasoned clear broth or consume(fat free) Sugar  Sample Menu Breakfast                                Lunch  Supper Cranberry juice                                           Jell-O                                     Grape juice                           Apple juice Coffee  or tea                        Jell-O                                      Popsicle                                                Coffee or tea                        Coffee or tea  _____________________________________________________________________     TAKE THESE MEDICATIONS MORNING OF SURGERY WITH A SIP OF WATER:  OMEPRAZOLE.  ONE VISITOR IS ALLOWED IN WAITING ROOM ONLY DAY OF SURGERY.  YOU MAY HAVE ANOTHER PERSON SWITCH OUT WITH THE  1  VISITOR IN THE WAITING ROOM DAY OF SURGERY AND A MASK MUST BE WORN IN THE WAITING ROOM.    2 VISITORS  MAY VISIT IN THE EXTENDED RECOVERY ROOM UNTIL 800 PM ONLY 1 VISITOR AGE 44 AND OVER MAY SPEND THE NIGHT AND MUST BE IN EXTENDED RECOVERY ROOM NO LATER THAN 800 PM .   PM. ALL PERSONS VISITING IN EXTENDED RECOVERY ROOM MUST WEAR A MASK.                                    DO NOT WEAR JEWERLY, MAKE UP. DO NOT WEAR LOTIONS, POWDERS, PERFUMES OR NAIL POLISH ON YOUR FINGERNAILS. TOENAIL POLISH IS OK TO WEAR. DO NOT SHAVE FOR 48 HOURS PRIOR TO DAY OF SURGERY. MEN MAY SHAVE FACE AND NECK. CONTACTS, GLASSES, OR DENTURES MAY NOT BE WORN TO SURGERY.                                    [facility] IS NOT RESPONSIBLE  FOR ANY BELONGINGS.                                                                    Marland Kitchen           [facility] - Preparing for Surgery Before surgery, you can play an important role.  Because skin is not sterile, your skin  needs to be as free of germs as possible.  You can reduce the number of germs on your skin by washing with CHG (chlorahexidine gluconate) soap before surgery.  CHG is an antiseptic cleaner which kills germs and bonds with the skin to continue killing germs even after washing. Please DO NOT use if you have an allergy to CHG or antibacterial soaps.  If your skin becomes reddened/irritated stop using the CHG and inform your nurse when you arrive at Short Stay. Do not shave (including legs and underarms) for at least 48 hours prior to the  first CHG shower.  You may shave your face/neck. Please follow these instructions carefully:  1.  Shower with CHG Soap the night before surgery and the  morning of Surgery.  2.  If you choose to wash your hair, wash your hair first as usual with your  normal  shampoo.  3.  After you shampoo, rinse your hair and body thoroughly to remove the  shampoo.                            4.  Use CHG as you would any other liquid soap.  You can apply chg directly  to the skin and wash                      Gently with a scrungie or clean washcloth.  5.  Apply the CHG Soap to your body ONLY FROM THE NECK DOWN.   Do not use on face/ open                           Wound or open sores. Avoid contact with eyes, ears mouth and genitals (private parts).                       Wash face,  Genitals (private parts) with your normal soap.             6.  Wash thoroughly, paying special attention to the area where your surgery  will be performed.  7.  Thoroughly rinse your body with warm water from the neck down.  8.  DO NOT shower/wash with your normal soap after using and rinsing off  the CHG Soap.                9.  Pat yourself dry with a clean towel.            10.  Wear clean pajamas.            11.  Place clean sheets on your bed the night of your first shower and do not  sleep with pets. Day of Surgery : Do not apply any lotions/deodorants the morning of surgery.  Please wear clean clothes to the hospital/surgery center.  IF YOU HAVE ANY SKIN IRRITATION OR PROBLEMS WITH THE SURGICAL SOAP, PLEASE GET A BAR OF GOLD DIAL SOAP AND SHOWER THE NIGHT BEFORE YOUR SURGERY AND THE MORNING OF YOUR SURGERY. PLEASE LET THE NURSE KNOW MORNING OF YOUR SURGERY IF YOU HAD ANY PROBLEMS WITH THE SURGICAL SOAP.  FAILURE TO FOLLOW THESE INSTRUCTIONS MAY RESULT IN THE CANCELLATION OF YOUR SURGERY PATIENT SIGNATURE_________________________________  NURSE  SIGNATURE__________________________________  ________________________________________________________________________  QUESTIONS CALL Quientin Jent PRE OP NURSE PHONE [phone].

## 2021-04-17 ENCOUNTER — Encounter (HOSPITAL_COMMUNITY)
Admission: RE | Admit: 2021-04-17 | Discharge: 2021-04-17 | Disposition: A | Discharge status: Home / Self Care | Payer: 59 | Source: Ambulatory Visit | Attending: Obstetrics and Gynecology | Admitting: Obstetrics and Gynecology

## 2021-04-17 ENCOUNTER — Other Ambulatory Visit: Payer: Self-pay

## 2021-04-17 DIAGNOSIS — Z01818 Encounter for other preprocedural examination: Secondary | ICD-10-CM

## 2021-04-17 DIAGNOSIS — Z01812 Encounter for preprocedural laboratory examination: Secondary | ICD-10-CM | POA: Insufficient documentation

## 2021-04-17 LAB — BASIC METABOLIC PANEL
Anion gap: 4 — ABNORMAL LOW (ref 5–15)
BUN: 13 mg/dL (ref 6–20)
CO2: 24 mmol/L (ref 22–32)
Calcium: 9 mg/dL (ref 8.9–10.3)
Chloride: 107 mmol/L (ref 98–111)
Creatinine, Ser: 0.89 mg/dL (ref 0.44–1.00)
GFR, Estimated: >60 mL/min (ref >60)
Glucose, Bld: 95 mg/dL (ref 70–99)
Potassium: 3.9 mmol/L (ref 3.5–5.1)
Sodium: 135 mmol/L (ref 135–145)

## 2021-04-17 LAB — CBC
HCT: 40.5 % (ref 36.0–46.0)
Hemoglobin: 13.4 g/dL (ref 12.0–15.0)
MCH: 32.1 pg (ref 26.0–34.0)
MCHC: 33.1 g/dL (ref 30.0–36.0)
MCV: 97.1 fL (ref 80.0–100.0)
Platelets: 247 10*3/uL (ref 150–400)
RBC: 4.17 MIL/uL (ref 3.87–5.11)
RDW: 12.4 % (ref 11.5–15.5)
WBC: 6.2 10*3/uL (ref 4.0–10.5)
nRBC: 0 % (ref 0.0–0.2)

## 2021-04-20 ENCOUNTER — Ambulatory Visit (HOSPITAL_BASED_OUTPATIENT_CLINIC_OR_DEPARTMENT_OTHER): Payer: 59 | Admitting: Certified Registered"

## 2021-04-20 ENCOUNTER — Encounter (HOSPITAL_BASED_OUTPATIENT_CLINIC_OR_DEPARTMENT_OTHER): Payer: Self-pay | Admitting: Obstetrics and Gynecology

## 2021-04-20 ENCOUNTER — Encounter (HOSPITAL_BASED_OUTPATIENT_CLINIC_OR_DEPARTMENT_OTHER)
Admission: RE | Disposition: A | Discharge status: Home / Self Care | Payer: Self-pay | Source: Ambulatory Visit | Attending: Obstetrics and Gynecology

## 2021-04-20 ENCOUNTER — Ambulatory Visit (HOSPITAL_BASED_OUTPATIENT_CLINIC_OR_DEPARTMENT_OTHER)
Admission: RE | Admit: 2021-04-20 | Discharge: 2021-04-20 | Disposition: A | Discharge status: Home / Self Care | Payer: 59 | Source: Ambulatory Visit | Attending: Obstetrics and Gynecology | Admitting: Obstetrics and Gynecology

## 2021-04-20 DIAGNOSIS — N8003 Adenomyosis of the uterus: Secondary | ICD-10-CM | POA: Diagnosis not present

## 2021-04-20 DIAGNOSIS — K449 Diaphragmatic hernia without obstruction or gangrene: Secondary | ICD-10-CM | POA: Diagnosis not present

## 2021-04-20 DIAGNOSIS — D250 Submucous leiomyoma of uterus: Secondary | ICD-10-CM

## 2021-04-20 DIAGNOSIS — Z01818 Encounter for other preprocedural examination: Secondary | ICD-10-CM

## 2021-04-20 DIAGNOSIS — R102 Pelvic and perineal pain unspecified side: Secondary | ICD-10-CM

## 2021-04-20 DIAGNOSIS — K219 Gastro-esophageal reflux disease without esophagitis: Secondary | ICD-10-CM | POA: Insufficient documentation

## 2021-04-20 DIAGNOSIS — G8929 Other chronic pain: Secondary | ICD-10-CM | POA: Diagnosis not present

## 2021-04-20 DIAGNOSIS — N809 Endometriosis, unspecified: Secondary | ICD-10-CM

## 2021-04-20 DIAGNOSIS — N803 Endometriosis of pelvic peritoneum, unspecified: Secondary | ICD-10-CM

## 2021-04-20 HISTORY — PX: ROBOTIC ASSISTED LAPAROSCOPIC HYSTERECTOMY AND SALPINGECTOMY: SHX6379

## 2021-04-20 HISTORY — DX: Presence of spectacles and contact lenses: Z97.3

## 2021-04-20 HISTORY — DX: Complete or unspecified spontaneous abortion without complication: O03.9

## 2021-04-20 HISTORY — DX: Endometriosis, unspecified: N80.9

## 2021-04-20 HISTORY — DX: Personal history of other diseases of the digestive system: Z87.19

## 2021-04-20 HISTORY — DX: Gastro-esophageal reflux disease without esophagitis: K21.9

## 2021-04-20 HISTORY — DX: Anemia, unspecified: D64.9

## 2021-04-20 HISTORY — DX: Unspecified osteoarthritis, unspecified site: M19.90

## 2021-04-20 HISTORY — DX: Leiomyoma of uterus, unspecified: D25.9

## 2021-04-20 LAB — TYPE AND SCREEN
ABO/RH(D): O POS
Antibody Screen: NEGATIVE

## 2021-04-20 LAB — POCT PREGNANCY, URINE: Preg Test, Ur: NEGATIVE

## 2021-04-20 SURGERY — XI ROBOTIC ASSISTED LAPAROSCOPIC HYSTERECTOMY AND SALPINGECTOMY
Anesthesia: General | Laterality: Bilateral

## 2021-04-20 MED ORDER — SODIUM CHLORIDE 0.9 % IV SOLN
INTRAVENOUS | Status: DC | PRN
  Administered 2021-04-20: 100 mL

## 2021-04-20 MED ORDER — SODIUM CHLORIDE 0.9 % IR SOLN
Status: DC | PRN
  Administered 2021-04-20 (×2): 1000 mL

## 2021-04-20 MED ORDER — KETOROLAC TROMETHAMINE 30 MG/ML IJ SOLN
INTRAMUSCULAR | Status: DC | PRN
  Administered 2021-04-20: 30 mg via INTRAVENOUS

## 2021-04-20 MED ORDER — ACETAMINOPHEN 500 MG PO TABS
ORAL_TABLET | ORAL | Status: AC
  Filled 2021-04-20: qty 2

## 2021-04-20 MED ORDER — DIPHENHYDRAMINE HCL 50 MG/ML IJ SOLN
INTRAMUSCULAR | Status: DC | PRN
  Administered 2021-04-20: 6.25 mg via INTRAVENOUS

## 2021-04-20 MED ORDER — DEXAMETHASONE SODIUM PHOSPHATE 10 MG/ML IJ SOLN
INTRAMUSCULAR | Status: AC
  Filled 2021-04-20: qty 1

## 2021-04-20 MED ORDER — FENTANYL CITRATE (PF) 250 MCG/5ML IJ SOLN
INTRAMUSCULAR | Status: AC
  Filled 2021-04-20: qty 5

## 2021-04-20 MED ORDER — HYDROMORPHONE HCL 2 MG/ML IJ SOLN
INTRAMUSCULAR | Status: AC
  Filled 2021-04-20: qty 1

## 2021-04-20 MED ORDER — ONDANSETRON HCL 4 MG/2ML IJ SOLN
INTRAMUSCULAR | Status: AC
  Filled 2021-04-20: qty 2

## 2021-04-20 MED ORDER — LACTATED RINGERS IV SOLN: INTRAVENOUS | Status: DC

## 2021-04-20 MED ORDER — FENTANYL CITRATE (PF) 100 MCG/2ML IJ SOLN
25.0000 ug | INTRAMUSCULAR | Status: DC | PRN
  Administered 2021-04-20: 25 ug via INTRAVENOUS

## 2021-04-20 MED ORDER — ENSURE PRE-SURGERY PO LIQD: 592.0000 mL | Freq: Once | ORAL | Status: DC

## 2021-04-20 MED ORDER — ROCURONIUM BROMIDE 10 MG/ML (PF) SYRINGE
PREFILLED_SYRINGE | INTRAVENOUS | Status: AC
  Filled 2021-04-20: qty 10

## 2021-04-20 MED ORDER — KETAMINE HCL 10 MG/ML IJ SOLN
INTRAMUSCULAR | Status: DC | PRN
  Administered 2021-04-20 (×5): 10 mg via INTRAVENOUS

## 2021-04-20 MED ORDER — OXYCODONE HCL 5 MG PO TABS
ORAL_TABLET | ORAL | 0 refills | Status: AC
Start: 2021-04-20 — End: ?

## 2021-04-20 MED ORDER — ACETAMINOPHEN 500 MG PO TABS: ORAL_TABLET | ORAL | 1 refills | Status: AC

## 2021-04-20 MED ORDER — DOCUSATE SODIUM 100 MG PO CAPS: 100.0000 mg | ORAL_CAPSULE | Freq: Two times a day (BID) | ORAL | Status: DC

## 2021-04-20 MED ORDER — ACETAMINOPHEN 500 MG PO TABS
1000.0000 mg | ORAL_TABLET | Freq: Four times a day (QID) | ORAL | Status: DC
  Administered 2021-04-20: 1000 mg via ORAL

## 2021-04-20 MED ORDER — AMISULPRIDE (ANTIEMETIC) 5 MG/2ML IV SOLN: 10.0000 mg | Freq: Once | INTRAVENOUS | Status: DC | PRN

## 2021-04-20 MED ORDER — PROPOFOL 10 MG/ML IV BOLUS
INTRAVENOUS | Status: DC | PRN
  Administered 2021-04-20: 150 mg via INTRAVENOUS

## 2021-04-20 MED ORDER — DEXAMETHASONE SODIUM PHOSPHATE 10 MG/ML IJ SOLN
INTRAMUSCULAR | Status: DC | PRN
  Administered 2021-04-20: 10 mg via INTRAVENOUS

## 2021-04-20 MED ORDER — ACETAMINOPHEN 500 MG PO TABS
1000.0000 mg | ORAL_TABLET | ORAL | Status: AC
  Administered 2021-04-20: 1000 mg via ORAL

## 2021-04-20 MED ORDER — GABAPENTIN 300 MG PO CAPS
300.0000 mg | ORAL_CAPSULE | ORAL | Status: AC
  Administered 2021-04-20: 300 mg via ORAL

## 2021-04-20 MED ORDER — SUGAMMADEX SODIUM 200 MG/2ML IV SOLN
INTRAVENOUS | Status: DC | PRN
  Administered 2021-04-20: 200 mg via INTRAVENOUS

## 2021-04-20 MED ORDER — GABAPENTIN 300 MG PO CAPS
ORAL_CAPSULE | ORAL | Status: AC
  Filled 2021-04-20: qty 1

## 2021-04-20 MED ORDER — KETOROLAC TROMETHAMINE 30 MG/ML IJ SOLN: 30.0000 mg | Freq: Four times a day (QID) | INTRAMUSCULAR | Status: DC

## 2021-04-20 MED ORDER — FENTANYL CITRATE (PF) 100 MCG/2ML IJ SOLN
INTRAMUSCULAR | Status: DC | PRN
  Administered 2021-04-20 (×5): 50 ug via INTRAVENOUS

## 2021-04-20 MED ORDER — OXYCODONE HCL 5 MG PO TABS
ORAL_TABLET | ORAL | Status: AC
  Filled 2021-04-20: qty 1

## 2021-04-20 MED ORDER — IBUPROFEN 600 MG PO TABS: ORAL_TABLET | ORAL | 1 refills | Status: AC

## 2021-04-20 MED ORDER — LIDOCAINE 2% (20 MG/ML) 5 ML SYRINGE
INTRAMUSCULAR | Status: AC
  Filled 2021-04-20: qty 5

## 2021-04-20 MED ORDER — KETAMINE HCL 50 MG/5ML IJ SOSY
PREFILLED_SYRINGE | INTRAMUSCULAR | Status: AC
  Filled 2021-04-20: qty 5

## 2021-04-20 MED ORDER — CEFAZOLIN SODIUM-DEXTROSE 2-4 GM/100ML-% IV SOLN
INTRAVENOUS | Status: AC
  Filled 2021-04-20: qty 100

## 2021-04-20 MED ORDER — MIDAZOLAM HCL 5 MG/5ML IJ SOLN
INTRAMUSCULAR | Status: DC | PRN
  Administered 2021-04-20: 2 mg via INTRAVENOUS

## 2021-04-20 MED ORDER — DEXMEDETOMIDINE (PRECEDEX) IN NS 20 MCG/5ML (4 MCG/ML) IV SYRINGE
PREFILLED_SYRINGE | INTRAVENOUS | Status: AC
  Filled 2021-04-20: qty 5

## 2021-04-20 MED ORDER — DEXMEDETOMIDINE (PRECEDEX) IN NS 20 MCG/5ML (4 MCG/ML) IV SYRINGE
PREFILLED_SYRINGE | INTRAVENOUS | Status: DC | PRN
  Administered 2021-04-20 (×2): 4 ug via INTRAVENOUS

## 2021-04-20 MED ORDER — FENTANYL CITRATE (PF) 100 MCG/2ML IJ SOLN
INTRAMUSCULAR | Status: AC
  Filled 2021-04-20: qty 2

## 2021-04-20 MED ORDER — LIDOCAINE 2% (20 MG/ML) 5 ML SYRINGE
INTRAMUSCULAR | Status: DC | PRN
  Administered 2021-04-20: 50 mg via INTRAVENOUS

## 2021-04-20 MED ORDER — ONDANSETRON HCL 4 MG/2ML IJ SOLN: 4.0000 mg | Freq: Four times a day (QID) | INTRAMUSCULAR | Status: DC | PRN

## 2021-04-20 MED ORDER — KETOROLAC TROMETHAMINE 30 MG/ML IJ SOLN
INTRAMUSCULAR | Status: AC
  Filled 2021-04-20: qty 1

## 2021-04-20 MED ORDER — ROCURONIUM BROMIDE 100 MG/10ML IV SOLN
INTRAVENOUS | Status: DC | PRN
  Administered 2021-04-20: 10 mg via INTRAVENOUS
  Administered 2021-04-20 (×2): 20 mg via INTRAVENOUS
  Administered 2021-04-20: 50 mg via INTRAVENOUS

## 2021-04-20 MED ORDER — MENTHOL 3 MG MT LOZG: 1.0000 | LOZENGE | OROMUCOSAL | Status: DC | PRN

## 2021-04-20 MED ORDER — OXYCODONE HCL 5 MG PO TABS: 5.0000 mg | ORAL_TABLET | ORAL | 0 refills | Status: AC | PRN

## 2021-04-20 MED ORDER — DIPHENHYDRAMINE HCL 50 MG/ML IJ SOLN
INTRAMUSCULAR | Status: AC
  Filled 2021-04-20: qty 1

## 2021-04-20 MED ORDER — ONDANSETRON HCL 4 MG PO TABS: 4.0000 mg | ORAL_TABLET | Freq: Four times a day (QID) | ORAL | Status: DC | PRN

## 2021-04-20 MED ORDER — SIMETHICONE 80 MG PO CHEW: 80.0000 mg | CHEWABLE_TABLET | Freq: Four times a day (QID) | ORAL | Status: DC | PRN

## 2021-04-20 MED ORDER — ONDANSETRON HCL 4 MG/2ML IJ SOLN
INTRAMUSCULAR | Status: DC | PRN
  Administered 2021-04-20 (×2): 4 mg via INTRAVENOUS

## 2021-04-20 MED ORDER — SCOPOLAMINE 1 MG/3DAYS TD PT72
MEDICATED_PATCH | TRANSDERMAL | Status: AC
  Filled 2021-04-20: qty 1

## 2021-04-20 MED ORDER — MIDAZOLAM HCL 2 MG/2ML IJ SOLN
INTRAMUSCULAR | Status: AC
  Filled 2021-04-20: qty 2

## 2021-04-20 MED ORDER — CEFAZOLIN SODIUM-DEXTROSE 2-4 GM/100ML-% IV SOLN
2.0000 g | INTRAVENOUS | Status: AC
  Administered 2021-04-20: 2 g via INTRAVENOUS

## 2021-04-20 MED ORDER — OXYCODONE HCL 5 MG PO TABS
5.0000 mg | ORAL_TABLET | ORAL | Status: DC | PRN
  Administered 2021-04-20: 5 mg via ORAL

## 2021-04-20 MED ORDER — ENSURE PRE-SURGERY PO LIQD: 296.0000 mL | Freq: Once | ORAL | Status: DC

## 2021-04-20 MED ORDER — SCOPOLAMINE 1 MG/3DAYS TD PT72
1.0000 | MEDICATED_PATCH | TRANSDERMAL | Status: DC
  Administered 2021-04-20: 1.5 mg via TRANSDERMAL

## 2021-04-20 MED ORDER — IBUPROFEN 600 MG PO TABS: 600.0000 mg | ORAL_TABLET | Freq: Four times a day (QID) | ORAL | 0 refills | Status: AC | PRN

## 2021-04-20 SURGICAL SUPPLY — 72 items
ADH SKN CLS APL DERMABOND .7 (GAUZE/BANDAGES/DRESSINGS) ×1
BARRIER ADHS 3X4 INTERCEED (GAUZE/BANDAGES/DRESSINGS) ×3 IMPLANT
BRR ADH 4X3 ABS CNTRL BYND (GAUZE/BANDAGES/DRESSINGS) ×2
CATH FOLEY 3WAY  5CC 16FR (CATHETERS) ×2
CATH FOLEY 3WAY 5CC 16FR (CATHETERS) ×1 IMPLANT
COVER BACK TABLE 60X90IN (DRAPES) ×2 IMPLANT
COVER LIGHT HANDLE UNIVERSAL (MISCELLANEOUS) ×1 IMPLANT
COVER TIP SHEARS 8 DVNC (MISCELLANEOUS) ×1 IMPLANT
COVER TIP SHEARS 8MM DA VINCI (MISCELLANEOUS) ×2
DECANTER SPIKE VIAL GLASS SM (MISCELLANEOUS) ×4 IMPLANT
DEFOGGER SCOPE WARMER CLEARIFY (MISCELLANEOUS) ×2 IMPLANT
DERMABOND ADVANCED (GAUZE/BANDAGES/DRESSINGS) ×1
DERMABOND ADVANCED .7 DNX12 (GAUZE/BANDAGES/DRESSINGS) ×1 IMPLANT
DILATOR CANAL MILEX (MISCELLANEOUS) IMPLANT
DRAPE ARM DVNC X/XI (DISPOSABLE) ×4 IMPLANT
DRAPE COLUMN DVNC XI (DISPOSABLE) ×1 IMPLANT
DRAPE DA VINCI XI ARM (DISPOSABLE) ×8
DRAPE DA VINCI XI COLUMN (DISPOSABLE) ×2
DRAPE UTILITY XL STRL (DRAPES) ×2 IMPLANT
DURAPREP 26ML APPLICATOR (WOUND CARE) ×2 IMPLANT
ELECT REM PT RETURN 9FT ADLT (ELECTROSURGICAL) ×2
ELECTRODE REM PT RTRN 9FT ADLT (ELECTROSURGICAL) ×1 IMPLANT
GAUZE 4X4 16PLY ~~LOC~~+RFID DBL (SPONGE) ×4 IMPLANT
GLOVE SURG ENC MOIS LTX SZ7 (GLOVE) ×2 IMPLANT
GLOVE SURG LTX SZ6.5 (GLOVE) ×6 IMPLANT
GLOVE SURG UNDER POLY LF SZ7 (GLOVE) ×6 IMPLANT
HOLDER FOLEY CATH W/STRAP (MISCELLANEOUS) ×1 IMPLANT
IRRIG SUCT STRYKERFLOW 2 WTIP (MISCELLANEOUS) ×2
IRRIGATION SUCT STRKRFLW 2 WTP (MISCELLANEOUS) ×1 IMPLANT
KIT TURNOVER CYSTO (KITS) ×2 IMPLANT
LEGGING LITHOTOMY PAIR STRL (DRAPES) ×2 IMPLANT
MANIPULATOR UTERINE 4.5 ZUMI (MISCELLANEOUS) IMPLANT
NEEDLE HYPO 22GX1.5 SAFETY (NEEDLE) ×2 IMPLANT
OBTURATOR OPTICAL STANDARD 8MM (TROCAR) ×2
OBTURATOR OPTICAL STND 8 DVNC (TROCAR) ×1
OBTURATOR OPTICALSTD 8 DVNC (TROCAR) ×1 IMPLANT
OCCLUDER COLPOPNEUMO (BALLOONS) ×2 IMPLANT
PACK ROBOT WH (CUSTOM PROCEDURE TRAY) ×2 IMPLANT
PACK ROBOTIC GOWN (GOWN DISPOSABLE) ×2 IMPLANT
PACK TRENDGUARD 450 HYBRID PRO (MISCELLANEOUS) ×1 IMPLANT
PAD OB MATERNITY 4.3X12.25 (PERSONAL CARE ITEMS) ×2 IMPLANT
PAD PREP 24X48 CUFFED NSTRL (MISCELLANEOUS) ×2 IMPLANT
POUCH LAPAROSCOPIC INSTRUMENT (MISCELLANEOUS) ×2 IMPLANT
PROTECTOR NERVE ULNAR (MISCELLANEOUS) ×6 IMPLANT
SEAL CANN UNIV 5-8 DVNC XI (MISCELLANEOUS) ×4 IMPLANT
SEAL XI 5MM-8MM UNIVERSAL (MISCELLANEOUS) ×8
SEALER VESSEL DA VINCI XI (MISCELLANEOUS)
SEALER VESSEL EXT DVNC XI (MISCELLANEOUS) IMPLANT
SET IRRIG Y TYPE TUR BLADDER L (SET/KITS/TRAYS/PACK) IMPLANT
SET TRI-LUMEN FLTR TB AIRSEAL (TUBING) ×2 IMPLANT
SOL PREP PROV IODINE SCRUB 4OZ (MISCELLANEOUS) ×1 IMPLANT
SPONGE T-LAP 4X18 ~~LOC~~+RFID (SPONGE) ×2 IMPLANT
STRIP CLOSURE SKIN 1/4X4 (GAUZE/BANDAGES/DRESSINGS) ×2 IMPLANT
SUT MNCRL AB 3-0 PS2 27 (SUTURE) ×4 IMPLANT
SUT VIC AB 0 CT1 27 (SUTURE) ×4
SUT VIC AB 0 CT1 27XBRD ANBCTR (SUTURE) ×2 IMPLANT
SUT VIC AB 2-0 UR6 27 (SUTURE) ×4 IMPLANT
SUT VICRYL 0 UR6 27IN ABS (SUTURE) ×2 IMPLANT
SUT VLOC 180 0 6IN GS21 (SUTURE) ×1 IMPLANT
SUT VLOC 180 0 9IN  GS21 (SUTURE) ×4
SUT VLOC 180 0 9IN GS21 (SUTURE) ×2 IMPLANT
SYR TOOMEY IRRIG 70ML (MISCELLANEOUS) ×2
SYRINGE TOOMEY IRRIG 70ML (MISCELLANEOUS) IMPLANT
TIP RUMI ORANGE 6.7MMX12CM (TIP) IMPLANT
TIP UTERINE 5.1X6CM LAV DISP (MISCELLANEOUS) IMPLANT
TIP UTERINE 6.7X10CM GRN DISP (MISCELLANEOUS) IMPLANT
TIP UTERINE 6.7X6CM WHT DISP (MISCELLANEOUS) IMPLANT
TIP UTERINE 6.7X8CM BLUE DISP (MISCELLANEOUS) ×1 IMPLANT
TOWEL OR 17X26 10 PK STRL BLUE (TOWEL DISPOSABLE) ×2 IMPLANT
TRENDGUARD 450 HYBRID PRO PACK (MISCELLANEOUS) ×2
TROCAR PORT AIRSEAL 8X120 (TROCAR) ×2 IMPLANT
WATER STERILE IRR 1000ML POUR (IV SOLUTION) ×2 IMPLANT

## 2021-04-20 NOTE — Transfer of Care (Signed)
Immediate Anesthesia Transfer of Care Note  Patient: [name]  Procedure(s) Performed: XI ROBOTIC ASSISTED LAPAROSCOPIC HYSTERECTOMY AND BILATERAL SALPINGECTOMY (Bilateral)  Patient Location: PACU  Anesthesia Type:General  Level of Consciousness: drowsy and patient cooperative  Airway & Oxygen Therapy: Patient Spontanous Breathing and Patient connected to face mask oxygen  Post-op Assessment: Report given to RN and Post -op Vital signs reviewed and stable  Post vital signs: Reviewed and stable  Last Vitals:  Vitals Value Taken Time  BP 124/56 04/20/21 1436  Temp    Pulse 80 04/20/21 1439  Resp 15 04/20/21 1439  SpO2 100 % 04/20/21 1439  Vitals shown include unvalidated device data.  Last Pain:  Vitals:   04/20/21 1003  TempSrc: Oral  PainSc: 0-No pain      Patients Stated Pain Goal: 7 (34/96/11 6435)  Complications: No notable events documented.

## 2021-04-20 NOTE — Anesthesia Preprocedure Evaluation (Signed)
Anesthesia Evaluation  Patient identified by MRN, date of birth, ID band Patient awake    Reviewed: Allergy & Precautions, NPO status , Patient's Chart, lab work & pertinent test results  Airway Mallampati: II  TM Distance: >3 FB Neck ROM: Full    Dental  (+) Dental Advisory Given   Pulmonary neg pulmonary ROS,    breath sounds clear to auscultation       Cardiovascular negative cardio ROS   Rhythm:Regular Rate:Normal     Neuro/Psych negative neurological ROS     GI/Hepatic Neg liver ROS, hiatal hernia, GERD  ,  Endo/Other  negative endocrine ROS  Renal/GU negative Renal ROS     Musculoskeletal   Abdominal   Peds  Hematology negative hematology ROS (+)   Anesthesia Other Findings   Reproductive/Obstetrics                             Lab Results  Component Value Date   WBC 6.2 04/17/2021   HGB 13.4 04/17/2021   HCT 40.5 04/17/2021   MCV 97.1 04/17/2021   PLT 247 04/17/2021   Lab Results  Component Value Date   CREATININE 0.89 04/17/2021   BUN 13 04/17/2021   NA 135 04/17/2021   K 3.9 04/17/2021   CL 107 04/17/2021   CO2 24 04/17/2021    Anesthesia Physical Anesthesia Plan  ASA: 2  Anesthesia Plan: General   Post-op Pain Management: Tylenol PO (pre-op), Toradol IV (intra-op) and Ketamine IV   Induction: Intravenous  PONV Risk Score and Plan: 4 or greater and Scopolamine patch - Pre-op, Midazolam, Dexamethasone, Ondansetron and Treatment may vary due to age or medical condition  Airway Management Planned: Oral ETT  Additional Equipment: None  Intra-op Plan:   Post-operative Plan: Extubation in OR  Informed Consent: I have reviewed the patients History and Physical, chart, labs and discussed the procedure including the risks, benefits and alternatives for the proposed anesthesia with the patient or authorized representative who has indicated his/her understanding  and acceptance.     Dental advisory given  Plan Discussed with: CRNA  Anesthesia Plan Comments:         Anesthesia Quick Evaluation

## 2021-04-20 NOTE — Anesthesia Procedure Notes (Signed)
Procedure Name: Intubation Date/Time: 04/20/2021 11:18 AM Performed by: Gwyndolyn Saxon, CRNA Pre-anesthesia Checklist: Patient identified, Emergency Drugs available, Suction available and Patient being monitored Patient Re-evaluated:Patient Re-evaluated prior to induction Oxygen Delivery Method: Circle system utilized Preoxygenation: Pre-oxygenation with 100% oxygen Induction Type: IV induction and Cricoid Pressure applied Ventilation: Mask ventilation without difficulty and Oral airway inserted - appropriate to patient size Laryngoscope Size: Sabra Heck and 2 Grade View: Grade II Tube type: Oral Tube size: 7.0 mm Number of attempts: 1 Airway Equipment and Method: Patient positioned with wedge pillow and Stylet Placement Confirmation: ETT inserted through vocal cords under direct vision, positive ETCO2 and breath sounds checked- equal and bilateral Secured at: 21 cm Tube secured with: Tape Dental Injury: Teeth and Oropharynx as per pre-operative assessment

## 2021-04-20 NOTE — Progress Notes (Signed)
Explanation of Duplicate Prescription Orders:  The patient's discharge orders had been signed, to include her prescriptions for :  Oxycodone, Acetaminophen and Ibuprofen [facility].  The patient's family then preferred her prescriptions to be sent [facility] [place] in [place] because it is a 24 hour pharmacy.  [facility]  [phone], spoke with Pharmacist-John to cancel the previous prescribed medications.    Following a consultation with [place] a new set of prescriptions (as outlined above) were [facility] J. Florene Glen, PA-C

## 2021-04-20 NOTE — Op Note (Signed)
Preoperative diagnosis: Chronic pelvic pain, endometriosis, adenomyosis  Postoperative diagnosis: Same   Anesthesia: General  Anesthesiologist: Dr. David Stall  Procedure: Robotically assisted total hysterectomy with bilateral salpingectomy  Surgeon: Dr. Katharine Look Homero Hyson  Assistant: Earnstine Regal P.A.-C.  Estimated blood loss: 25 cc  Procedure:  After being informed of the planned procedure with possible complications including but not limited to bleeding, infection, injury to other organs, need for laparotomy, expected hospital stay and recovery, informed consent is obtained and patient is taken to or #5. She is placed in  lithotomy position on [place] with both arms padded and tucked on each side and bilateral knee-high sequential compressive devices. She is given general anesthesia with endotracheal intubation without any complication. She is prepped and draped in a sterile fashion. A three-way Foley catheter is inserted in her bladder.  Pelvic exam reveals: anteverted, normal-size uterus, 2 normal adnexa  A weighted speculum is inserted in the vagina and the anterior lip of the cervix is grasped with a tenaculum forcep. We proceed with a paracervical block and vaginal infiltration using ropivacaine 0.5% diluted 1 in 1 with saline. The uterus was then sounded at 7.5 cm. We easily dilate the cervix using Hegar dilator to  #27 which allows placement of the intrauterine RUMI manipulator with a 3.0 KOH ring and a vaginal occluder. The ring is sutured to the cervix with 0 Vicryl. NOTE: 2 failed attempts at [place] placement and 1 failed attempt at [facility] placement. Finally decision was made to proceed with a #6 RUMI with no balloon.  Trocar placement is decided. We infiltrate the umbilicus with 10 cc of ropivacaine per protocol and perform a 10 mm vertical incision which is brought down bluntly to the fascia. The fascia is identified and grasped with Coker forceps. The fascia is incised with Mayo  scissors. Peritoneum is entered bluntly. A pursestring suture of 0 Vicryl is placed on the fascia and a 10 mm Hassan trocar is easily inserted in the abdominal cavity held in placed with a Purstring suture. This allows for easy insufflation of a pneumoperitoneum using warmed CO2 at a maximum pressure of 15 mm of mercury. 60 cc of Ropivacaine 0.5 % diluted 1 in 1 is sent in the pelvis and the patient is positioned in reverse Trendelenburg. We then placed one 51mm robotic trocar on the left, one 53mm robotic trocar on the right and one 8 mm patient's side assistant trocar on the right  after infiltrating every site  with ropivacaine per protocol. The robot is docked on the right of the patient after positioning her in Trendelenburg. A Vessel Seal is inserted in arm #4, a Long bipolar forceps is inserted in arm #2.  Preparation and docking is completed in 76 minutes.  Observation: anterior and posterior cul-de sac are normal except for 2 small endometriotic lesions on the left utero-sacral ligament <12 mm.Uterus, tubes and ovaries are normal. Fine adhesions between the right colon and the pelvic wall. Liver is normal. Appendix is not seen.  We start on the right side by sealing and cutting the mesosalpynx , the right utero-ovarian ligament and the right round ligament .  This gives [place] entry into the retroperitoneal space with an easy dissection of the anterior broad ligament. The ligament was opened all the way to the left round ligament.   We then proceed with systematic dissection of the bladder from the anterior vaginal cuff which is easily identified with the KOH ring. The plane of dissection is confirmed with filling the bladder  with 200 cc of saline. We are able to dissect the bladder 2 cm below the KOH ring. We then opened the posterior right broad ligament all the way to the posterior KOH ring after identifying the full course of the right ureter.   Moving to the left side we Seal and cut  the left  round ligament , the left utero-ovarian ligament and  the mesosalpinx in between. Entry into the retroperitoneal space allows [place] to complete dissection of the bladder on the left side and skeletonized the uterine vessels. The left broad ligament is then dissected all the way to the posterior KOH ring after identifying the full course of the left ureter.  Both tubes are removed from the pelvic cavity through the assistant's trocar.   With pressure on the KOH ring and the bladder fully dissected, we cauterize and cut the uterine vessels on both sides at the level of the KOH ring.  The vaginal occluder is inflated and we proceed with a 360 colpotomy using an open monopolar scissors and freeing the uterus entirely.  The uterus is delivered vaginally with traction. The vaginal occluder is reinserted in the vagina to maintain pneumoperitoneum.  Instruments are then modified for a suture cut in arm #4 and a long tip forcep in arm #2. We proceed with closure of the vaginal cuff with 2 running sutures of 0 V-Lock. We irrigated profusely with warm saline and confirm a satisfactory hemostasis as well as 2 normal ureters with good mobility and no dilatation.  A sheet of Interceed is placed on the vaginal cuff.  All instruments are then removed and the robot is undocked. Console time: 1 hours and 15 minutes.  All trochars are removed under direct visualization after evacuating the pneumoperitoneum.  The fascia of the supraumbilical incision is closed with the previously placed pursestring suture of 0 Vicryl. All incisions are then closed with subcuticular suture of 3-0 Monocryl and Dermabond.  A speculum is inserted in the vagina to confirm a adequate closure of the vaginal cuff and good hemostasis.  Instrument and sponge count is complete x2. The procedure is well tolerated by the patient is taken to recovery room in a well and stable condition.  Dr Cletis Media was present and scrubbed at all times. Surgical  assistance was required due to the complexity of the anatomy and the robotic approach of the procedure.   Estimated blood loss: 25  Specimen: Uterus and tubes weighing 67 g sent to pathology

## 2021-04-20 NOTE — Discharge Instructions (Addendum)
Call [facility] at [phone]  You have a temperature greater than or equal to 100.4 degrees Farenheit orally You have pain that is not made better by the pain medication given and taken as directed You have excessive bleeding or problems urinating  Take Colace (Docusate Sodium/Stool Softener) 100 mg 2-3 times daily while taking narcotic pain medicine to avoid constipation or until bowel movements are regular. Take Ibuprofen 600 mg with food every 6 hours for 5 days then as needed for post operative pain Take Acetaminophen 500 mg (Tylenol)  #2 tablets every 6 hours for 5 days then as needed for pain  You may drive after 2 weeks You may walk up steps  You may shower tomorrow You may resume a regular diet  Keep incisions clean and dry Do not lift over 15 pounds for 6 weeks Avoid anything in vagina for 6 weeks hy

## 2021-04-20 NOTE — Interval H&P Note (Signed)
History and Physical Interval Note:  04/20/2021 11:00 AM  Cathy [name]  has presented today for surgery, with the diagnosis of PELVIC AND PERINEAL PAIN AND ENDOMETRIOSIS.  The various methods of treatment have been discussed with the patient and family. After consideration of risks, benefits and other options for treatment, the patient has consented to  Procedure(s): XI ROBOTIC ASSISTED LAPAROSCOPIC HYSTERECTOMY AND SALPINGECTOMY (N/A) as a surgical intervention.  The patient's history has been reviewed, patient examined, no change in status, stable for surgery.  I have reviewed the patient's chart and labs.  Questions were answered to the patient's satisfaction.     Katharine Look A Aureliano Oshields

## 2021-04-21 ENCOUNTER — Encounter (HOSPITAL_BASED_OUTPATIENT_CLINIC_OR_DEPARTMENT_OTHER): Payer: Self-pay | Admitting: Obstetrics and Gynecology

## 2021-04-21 LAB — SURGICAL PATHOLOGY

## 2021-04-21 NOTE — Anesthesia Postprocedure Evaluation (Signed)
Anesthesia Post Note  Patient: [name]  Procedure(s) Performed: XI ROBOTIC ASSISTED LAPAROSCOPIC HYSTERECTOMY AND BILATERAL SALPINGECTOMY (Bilateral)     Patient location during evaluation: PACU Anesthesia Type: General Level of consciousness: awake and alert Pain management: pain level controlled Vital Signs Assessment: post-procedure vital signs reviewed and stable Respiratory status: spontaneous breathing, nonlabored ventilation, respiratory function stable and patient connected to nasal cannula oxygen Cardiovascular status: blood pressure returned to baseline and stable Postop Assessment: no apparent nausea or vomiting Anesthetic complications: no   No notable events documented.  Last Vitals:  Vitals:   04/20/21 1615 04/20/21 1754  BP: (!) 124/55 (!) 141/81  Pulse: 86 98  Resp: 17 18  Temp:  36.8 C  SpO2: 97% 99%    Last Pain:  Vitals:   04/20/21 1753  TempSrc:   PainSc: 5                  Tiajuana Amass

## 2021-06-26 ENCOUNTER — Emergency Department (HOSPITAL_BASED_OUTPATIENT_CLINIC_OR_DEPARTMENT_OTHER)
Admission: EM | Admit: 2021-06-26 | Discharge: 2021-06-26 | Disposition: A | Discharge status: Home / Self Care | Payer: 59 | Attending: Emergency Medicine | Admitting: Emergency Medicine

## 2021-06-26 ENCOUNTER — Other Ambulatory Visit: Payer: Self-pay

## 2021-06-26 ENCOUNTER — Encounter (HOSPITAL_BASED_OUTPATIENT_CLINIC_OR_DEPARTMENT_OTHER): Payer: Self-pay

## 2021-06-26 ENCOUNTER — Emergency Department (HOSPITAL_BASED_OUTPATIENT_CLINIC_OR_DEPARTMENT_OTHER): Payer: 59

## 2021-06-26 DIAGNOSIS — R112 Nausea with vomiting, unspecified: Secondary | ICD-10-CM | POA: Diagnosis present

## 2021-06-26 DIAGNOSIS — R000 Tachycardia, unspecified: Secondary | ICD-10-CM | POA: Diagnosis not present

## 2021-06-26 DIAGNOSIS — R34 Anuria and oliguria: Secondary | ICD-10-CM | POA: Diagnosis not present

## 2021-06-26 DIAGNOSIS — K529 Noninfective gastroenteritis and colitis, unspecified: Secondary | ICD-10-CM | POA: Insufficient documentation

## 2021-06-26 DIAGNOSIS — Z90710 Acquired absence of both cervix and uterus: Secondary | ICD-10-CM | POA: Insufficient documentation

## 2021-06-26 LAB — CBC
HCT: 41.8 % (ref 36.0–46.0)
Hemoglobin: 14.2 g/dL (ref 12.0–15.0)
MCH: 32.5 pg (ref 26.0–34.0)
MCHC: 34 g/dL (ref 30.0–36.0)
MCV: 95.7 fL (ref 80.0–100.0)
Platelets: 224 10*3/uL (ref 150–400)
RBC: 4.37 MIL/uL (ref 3.87–5.11)
RDW: 12.6 % (ref 11.5–15.5)
WBC: 6.8 10*3/uL (ref 4.0–10.5)
nRBC: 0 % (ref 0.0–0.2)

## 2021-06-26 LAB — COMPREHENSIVE METABOLIC PANEL
ALT: 44 U/L (ref 0–44)
AST: 26 U/L (ref 15–41)
Albumin: 4.3 g/dL (ref 3.5–5.0)
Alkaline Phosphatase: 91 U/L (ref 38–126)
Anion gap: 13 (ref 5–15)
BUN: 16 mg/dL (ref 6–20)
CO2: 18 mmol/L — ABNORMAL LOW (ref 22–32)
Calcium: 9.1 mg/dL (ref 8.9–10.3)
Chloride: 104 mmol/L (ref 98–111)
Creatinine, Ser: 0.95 mg/dL (ref 0.44–1.00)
GFR, Estimated: >60 mL/min (ref >60)
Glucose, Bld: 111 mg/dL — ABNORMAL HIGH (ref 70–99)
Potassium: 3.7 mmol/L (ref 3.5–5.1)
Sodium: 135 mmol/L (ref 135–145)
Total Bilirubin: 1 mg/dL (ref 0.3–1.2)
Total Protein: 7.9 g/dL (ref 6.5–8.1)

## 2021-06-26 LAB — URINALYSIS, ROUTINE W REFLEX MICROSCOPIC
Bilirubin Urine: NEGATIVE
Glucose, UA: NEGATIVE mg/dL
Ketones, ur: NEGATIVE mg/dL
Leukocytes,Ua: NEGATIVE
Nitrite: NEGATIVE
Protein, ur: 30 mg/dL — AB
Specific Gravity, Urine: >1.03 — ABNORMAL HIGH (ref 1.005–1.030)
pH: 6 (ref 5.0–8.0)

## 2021-06-26 LAB — URINALYSIS, MICROSCOPIC (REFLEX)

## 2021-06-26 LAB — LIPASE, BLOOD: Lipase: 22 U/L (ref 11–51)

## 2021-06-26 LAB — PREGNANCY, URINE: Preg Test, Ur: NEGATIVE

## 2021-06-26 MED ORDER — SODIUM CHLORIDE 0.9 % IV BOLUS
1000.0000 mL | Freq: Once | INTRAVENOUS | Status: AC
  Administered 2021-06-26: 1000 mL via INTRAVENOUS

## 2021-06-26 MED ORDER — ONDANSETRON HCL 4 MG PO TABS: 4.0000 mg | ORAL_TABLET | Freq: Four times a day (QID) | ORAL | 0 refills | Status: AC

## 2021-06-26 MED ORDER — ONDANSETRON HCL 4 MG/2ML IJ SOLN
4.0000 mg | Freq: Once | INTRAMUSCULAR | Status: AC
  Administered 2021-06-26: 4 mg via INTRAVENOUS
  Filled 2021-06-26: qty 2

## 2021-06-26 MED ORDER — DICYCLOMINE HCL 20 MG PO TABS: 20.0000 mg | ORAL_TABLET | Freq: Two times a day (BID) | ORAL | 0 refills | Status: AC

## 2021-06-26 MED ORDER — IOHEXOL 300 MG/ML  SOLN
100.0000 mL | Freq: Once | INTRAMUSCULAR | Status: AC | PRN
  Administered 2021-06-26: 100 mL via INTRAVENOUS

## 2021-06-26 NOTE — ED Notes (Signed)
Patient tolerated drinking ice water.

## 2021-06-26 NOTE — ED Provider Notes (Signed)
[facility] Provider Note   CSN: [id number] Arrival date & time: 06/26/21  1655     History  Chief Complaint  Patient presents with   Abdominal Pain    Cathy [name] is a 46 y.o. female.  With past medical history of endometriosis, GERD, remote history of pancreatitis who presents to the emergency department with abdominal pain.  Patient states that beginning Friday she started having right-sided abdominal pain with mild radiation to her right flank.  She states that she was at the beach on Friday however did not have any new foods.  No other sick contacts while there.  She states that Saturday she was "sick at a restaurant with nausea without vomiting."  She states that the abdominal pain has persisted since then.  She states that she began having watery diarrhea, nausea and vomiting "all day today."  She states that the diarrhea is green.  Endorses decreased urine production without dysuria.  She has been able to tolerate some ginger ale this afternoon.  Denies vaginal discharge.  Denies fevers.  She does have a history of hysterectomy as well as cholecystectomy.   Abdominal Pain Associated symptoms: diarrhea, nausea and vomiting   Associated symptoms: no chest pain, no dysuria, no fever, no hematuria, no shortness of breath and no vaginal discharge       Home Medications Prior to Admission medications   Medication Sig Start Date End Date Taking? Authorizing Provider  acetaminophen (TYLENOL) 500 MG tablet Take 2 tablets po every 6 hours for 5 days then prn-post operative pain 04/20/21   Earnstine Regal, PA-C  acetaminophen (TYLENOL) 500 MG tablet take 2 tablets po every 6 hours for 5 days then prn for post operative pain 04/20/21   Earnstine Regal, PA-C  ibuprofen (ADVIL) 600 MG tablet take 1 tablet po pc every 6 hours for 5 days then prn-post operative pain 04/20/21   Earnstine Regal, PA-C  ibuprofen (ADVIL) 600 MG tablet take 1 tablet po pc every 6 hours  for 5 days then prn-post operative pain 04/20/21   Earnstine Regal, PA-C  ibuprofen (ADVIL) 600 MG tablet Take 1 tablet (600 mg total) by mouth every 6 (six) hours as needed. pc 04/20/21   Earnstine Regal, PA-C  levocetirizine (XYZAL) 5 MG tablet Take 5 mg by mouth at bedtime.    [provider]  omeprazole (PRILOSEC) 40 MG capsule Take 40 mg by mouth daily.    [provider]  oxyCODONE (ROXICODONE) 5 MG immediate release tablet Take 1 tablet (5 mg total) by mouth every 4 (four) hours as needed for severe pain. 04/20/21   Earnstine Regal, PA-C  oxyCODONE (ROXICODONE) 5 MG immediate release tablet take 1 tablet po every 6 hours as needed for breakthrough pain 04/20/21   Earnstine Regal, PA-C  tobramycin (TOBREX) 0.3 % ophthalmic ointment 1 application 3 (three) times daily.    [provider]      Allergies    Other, Sudafed [pseudoephedrine], Adhesive [tape], and Sulfa antibiotics    Review of Systems   Review of Systems  Constitutional:  Positive for appetite change. Negative for fever.  Respiratory:  Negative for shortness of breath.   Cardiovascular:  Negative for chest pain.  Gastrointestinal:  Positive for abdominal pain, diarrhea, nausea and vomiting. Negative for abdominal distention and blood in stool.  Genitourinary:  Positive for decreased urine volume. Negative for difficulty urinating, dysuria, hematuria and vaginal discharge.  Neurological:  Negative for light-headedness and headaches.  All  other systems reviewed and are negative.  Physical Exam Updated Vital Signs BP 139/76 (BP Location: Right Arm)    Pulse (!) 114    Temp 99.7 F (37.6 C) (Oral)    Resp 18    Ht 5\' 3"  (1.6 m)    Wt 88.9 kg    LMP 03/15/2021    SpO2 98%    BMI 34.72 kg/m  Physical Exam Vitals and nursing note reviewed.  Constitutional:      General: She is not in acute distress.    Appearance: Normal appearance. She is well-developed. She is ill-appearing. She is not toxic-appearing.   HENT:     Head: Normocephalic and atraumatic.     Nose: Nose normal.     Mouth/Throat:     Mouth: Mucous membranes are moist.     Pharynx: Oropharynx is clear.  Eyes:     General: No scleral icterus.    Extraocular Movements: Extraocular movements intact.  Cardiovascular:     Rate and Rhythm: Regular rhythm. Tachycardia present.     Heart sounds: Normal heart sounds. No murmur heard. Pulmonary:     Effort: Pulmonary effort is normal. No respiratory distress.     Breath sounds: Normal breath sounds.  Abdominal:     General: Abdomen is protuberant. Bowel sounds are normal. There is no distension.     Palpations: Abdomen is soft.     Tenderness: There is abdominal tenderness in the right upper quadrant and epigastric area. There is right CVA tenderness. There is no left CVA tenderness, guarding or rebound. Negative signs include Murphy's sign, Rovsing's sign and McBurney's sign.     Hernia: No hernia is present.  Musculoskeletal:        General: Normal range of motion.     Cervical back: Neck supple.  Skin:    General: Skin is warm and dry.     Capillary Refill: Capillary refill takes less than 2 seconds.     Coloration: Skin is not jaundiced.  Neurological:     General: No focal deficit present.     Mental Status: She is alert and oriented to person, place, and time. Mental status is at baseline.  Psychiatric:        Mood and Affect: Mood normal.        Behavior: Behavior normal.        Thought Content: Thought content normal.        Judgment: Judgment normal.    ED Results / Procedures / Treatments   Labs (all labs ordered are listed, but only abnormal results are displayed) Labs Reviewed  COMPREHENSIVE METABOLIC PANEL - Abnormal; Notable for the following components:      Result Value   CO2 18 (*)    Glucose, Bld 111 (*)    All other components within normal limits  URINALYSIS, ROUTINE W REFLEX MICROSCOPIC - Abnormal; Notable for the following components:   APPearance  CLOUDY (*)    Specific Gravity, Urine >1.030 (*)    Hgb urine dipstick MODERATE (*)    Protein, ur 30 (*)    All other components within normal limits  URINALYSIS, MICROSCOPIC (REFLEX) - Abnormal; Notable for the following components:   Bacteria, UA FEW (*)    All other components within normal limits  LIPASE, BLOOD  CBC  PREGNANCY, URINE   EKG EKG Interpretation  Date/Time:  Monday June 26 2021 17:19:24 EST Ventricular Rate:  115 PR Interval:  156 QRS Duration: 76 QT Interval:  298 QTC Calculation:  412 R Axis:   57 Text Interpretation: Sinus tachycardia Possible Anterior infarct , age undetermined Abnormal ECG When compared with ECG of 07-Aug-2010 21:05, PREVIOUS ECG IS PRESENT Confirmed by Wynona Dove ([id number]) on 06/27/2021 8:25:39 PM  Radiology No results found. CT Abdomen Pelvis W Contrast: No acute abnormality of the abdomen or pelvis.  Procedures Procedures   Medications Ordered in ED Medications  sodium chloride 0.9 % bolus 1,000 mL (0 mLs Intravenous Stopped 06/26/21 2211)  ondansetron (ZOFRAN) injection 4 mg (4 mg Intravenous Given 06/26/21 1941)  iohexol (OMNIPAQUE) 300 MG/ML solution 100 mL (100 mLs Intravenous Contrast Given 06/26/21 1927)   ED Course/ Medical Decision Making/ A&P                           Medical Decision Making Amount and/or Complexity of Data Reviewed Labs: ordered. Radiology: ordered.  Risk Prescription drug management.  Patient presents to the ED with complaints of abdominal pain. This involves an extensive number of treatment options, and is a complaint that carries with it a high risk of complications and morbidity.   Additional history obtained:  Additional history obtained from: Husband at bedside External records from outside source obtained and reviewed including: Review his primary care visits  EKG: EKG: sinus tachycardia.   Cardiac Monitoring: The patient was maintained on a cardiac monitor.  I personally viewed and  interpreted the cardiac monitored which showed an underlying rhythm of: Sinus tachycardia  Lab Results: I personally ordered, reviewed, and interpreted labs. Pertinent results include: CMP without AKI, transaminitis, electrolyte derangement CBC without leukocytosis, anemia Lipase negative Not pregnant UA with moderate blood, protein, no evidence of UTI  Imaging Studies ordered:  I ordered imaging studies which included CT.  I independently reviewed & interpreted imaging & am in agreement with radiology impression. Imaging shows: No abdominal or pelvic abnormalities  Medications  I ordered medication including Zofran for nausea, IV fluids for dehydration Reevaluation of the patient after medication shows that patient improved  ED Course: 46 year old female who presents to the emergency department with abdominal pain.  Her abdominal exam is without peritoneal signs.  There is no evidence of an acute abdomen.  She is ill-appearing but overall nontoxic. Considered broad differential initially including but not limited to hepatobiliary disease, pancreatitis, PUD, gastric perforation, hepatitis, pyelonephritis acute appendicitis, bowel obstruction diverticulitis, ACS. Her history and exam is not consistent with PUD, gastric perforation, bowel obstruction, vascular catastrophe, viscus perforation, diverticulitis, appendicitis.. Lipase 22, negative, doubt pancreatitis CMP without transaminitis, elevated bilirubin.  She does have mild right upper quadrant tenderness however no jaundice, no fever concerning for cholangitis.  She does have a history of cholecystectomy however CT exam does not show any evidence of cholangitis, choledocholithiasis. Appendix is not visualized on CT however she does not have right lower quadrant tenderness, no McBurney's point tenderness or Rovsing's positive.  No history of fevers.  No leukocytosis although not very sensitive.  There is no stranding or free fluid in the  right lower quadrant that would be consistent with appendicitis either.  I doubt appendicitis at this time. UA with moderate hemoglobin and protein, no AKI.  Symptoms are not exactly consistent with UTI or stone.  CT does not show any hydronephrosis, nephrolithiasis, evidence of pyelonephritis.  No UTI.  Given IV fluids here in the emergency department for dehydration which improved her tachycardia from the 110s down to 80.  Given Zofran for nausea which improved her symptoms.  At  this time her presentation is most consistent with gastroenteritis from unknown cause.  I have discussed with her the findings of her results.  She verbalized understanding.  Discussed that she should push fluids in the coming days as well as bland diet.  Discussed return precautions for worsening abdominal pain, nausea, vomiting and fevers not responsive to Tylenol or Motrin.  I will prescribe her Zofran as well as Bentyl for her symptoms.  She is agreeable to this.  Discussed follow-up primary care provider in a week.   After consideration of the diagnostic results and the patients response to treatment, I feel that the patent would benefit from discharge.  Consider admission for dehydration however symptoms improved with fluids and Zofran.  She was able to tolerate p.o. here in the emergency department. The patient has been appropriately medically screened and/or stabilized in the ED. I have low suspicion for any other emergent medical condition which would require further screening, evaluation or treatment in the ED or require inpatient management. The patient is overall well appearing and non-toxic in appearance. They are hemodynamically stable at time of discharge.   Final Clinical Impression(s) / ED Diagnoses Final diagnoses:  Gastroenteritis    Rx / DC Orders ED Discharge Orders          Ordered    ondansetron (ZOFRAN) 4 MG tablet  Every 6 hours        06/26/21 2155    dicyclomine (BENTYL) 20 MG tablet  2 times  daily        06/26/21 2155              Mickie Hillier, PA-C 07/04/21 0951    Jeanell Sparrow, DO 07/05/21 1257

## 2021-06-26 NOTE — Discharge Instructions (Addendum)
You were seen in the emergency department for nausea vomiting and diarrhea.  You likely have a viral gastroenteritis from something you ate.  I am giving you prescriptions for Zofran which is for nausea that you can take every 6 hours.  I am also giving a medication called Bentyl that will help with abdominal cramping they can take twice a day.  Please drink lots of fluids and eat a bland diet.  I provided instructions on food choices to help relieve diarrhea.  Please return to the emergency department if you are unable to tolerate any food or water or have worsening pain with fevers.  Otherwise please follow-up with your primary care provider.

## 2021-06-26 NOTE — ED Notes (Signed)
Patient discharged to home.  All discharge instructions reviewed.  Patient verbalized understanding via teachback method.  VS WDL.  Respirations even and unlabored.  Ambulatory out of ED.   °

## 2021-06-26 NOTE — ED Triage Notes (Signed)
Pt arrives with right sided abdominal pain going into her side with some radiation into right flank. Pt also reports watery diarrhea all day with some nausea. Pt reports pain started Friday, got worse after eating Saturday, felt like she needed to vomit, with pain in abdomen and side ever since.

## 2021-07-19 ENCOUNTER — Encounter: Payer: 59 | Attending: Internal Medicine | Admitting: Nutrition

## 2021-07-19 ENCOUNTER — Encounter: Payer: Self-pay | Admitting: Nutrition

## 2021-07-19 ENCOUNTER — Other Ambulatory Visit: Payer: Self-pay

## 2021-07-19 VITALS — Ht 63.0 in | Wt 188.0 lb

## 2021-07-19 DIAGNOSIS — E782 Mixed hyperlipidemia: Secondary | ICD-10-CM | POA: Insufficient documentation

## 2021-07-19 DIAGNOSIS — R7303 Prediabetes: Secondary | ICD-10-CM | POA: Insufficient documentation

## 2021-07-19 DIAGNOSIS — E669 Obesity, unspecified: Secondary | ICD-10-CM | POA: Insufficient documentation

## 2021-07-19 NOTE — Progress Notes (Signed)
Medical Nutrition Therapy  Appointment Start time:  0800  Appointment End time:  0930 Primary concerns today Obesity, Prediabetes, hyperlipidemia Referral diagnosis: E66.9, R 73.03, E78.0 Preferred learning style: no  preferences. Learning readiness: Ready and change in progress.    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 Wants to prevent diabetes and lose weight. Has Prediabtes, Hyperlipidemia and BMI of 33. Use to run and hasn't been exercising much but just started back walking. Current diet is inconsistent to meet her needs by only eating 2 meals per day, which is contributing to her elevated glucose levels and insulin resistance. She is motivated and willing to make adjustments with her meal choices, eating 3 meals per day and skipping meals.  Anthropometrics  Wt Readings from Last 3 Encounters:  06/26/21 196 lb (88.9 kg)  04/20/21 195 lb 12.8 oz (88.8 kg)  02/24/20 180 lb (81.6 kg)   Ht Readings from Last 3 Encounters:  06/26/21 '5\' 3"'$  (1.6 m)  04/20/21 5' (1.524 m)  02/24/20 '5\' 3"'$  (1.6 m)   There is no height or weight on file to calculate BMI. '@BMIFA'$ @ Facility age limit for growth percentiles is 20 years. Facility age limit for growth percentiles is 20 years.    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 Works in [employer]. Likes to run. Eats 2 meals per day  Estimated daily fluid intake: 80 oz Supplements:  Sleep: 10 Stress / self-care: none Current average weekly physical activity: Walking 2 miles 3 times per day  24-Hr Dietary Recall First Meal: water, coffee-latte with sf syrup Snack:  Second Meal: Chicken salad and potato salad, water Snack: water Third Meal: 7 pm Diet gingerale and 2 slice pizza-steak and cheese,  Snack:  Beverages: water and little diet sodas  Estimated Energy Needs Calories:  1200 Carbohydrate: 135g Protein: 90g Fat: 33g   NUTRITION DIAGNOSIS  NB-1.1 Food and nutrition-related knowledge deficit As related to Prediabetes.  As evidenced by A1C 5.7%.   NUTRITION INTERVENTION  Nutrition education (E-1) on the following topics:  Prediabetes Eating Plan Prediabetes is a condition that causes blood sugar (glucose) levels to be higher than normal. This increases the risk for developing type 2 diabetes (type 2 diabetes mellitus). Working with a health care provider or nutrition specialist (dietitian) to make diet and lifestyle changes can help prevent the onset of diabetes. These changes may help you: Control your blood glucose levels. Improve your cholesterol levels. Manage your blood pressure. What are tips for following this plan? Reading food labels Read food labels to check the amount of fat, salt (sodium), and sugar in prepackaged foods. Avoid foods that have: Saturated fats. Trans fats. Added sugars. Avoid foods that have more than 300 milligrams (mg) of sodium per serving. Limit your sodium intake to less than 2,300 mg each day. Shopping Avoid buying pre-made and processed foods. Avoid buying drinks with added sugar. Cooking Cook with olive oil. Do not use butter, lard, or ghee. Bake, broil, grill, steam, or boil foods. Avoid frying. Meal planning  Work with your dietitian to create an eating plan that is right for you. This may include tracking how many calories you take in each day. Use a food diary, notebook, or mobile application to track what you eat at each meal. Consider following a Mediterranean diet. This includes: Eating several servings of fresh fruits and vegetables each  day. Eating fish at least twice a week. Eating one serving each day of whole grains, beans, nuts, and seeds. Using olive oil instead of other fats. Limiting alcohol. Limiting red meat. Using nonfat or low-fat dairy products. Consider following a plant-based diet. This  includes dietary choices that focus on eating mostly vegetables and fruit, grains, beans, nuts, and seeds. If you have high blood pressure, you may need to limit your sodium intake or follow a diet such as the DASH (Dietary Approaches to Stop Hypertension) eating plan. The DASH diet aims to lower high blood pressure. Lifestyle Set weight loss goals with help from your health care team. It is recommended that most people with prediabetes lose 7% of their body weight. Exercise for at least 30 minutes 5 or more days a week. Attend a support group or seek support from a mental health counselor. Take over-the-counter and prescription medicines only as told by your health care provider. What foods are recommended? Fruits Berries. Bananas. Apples. Oranges. Grapes. Papaya. Mango. Pomegranate. Kiwi. Grapefruit. Cherries. Vegetables Lettuce. Spinach. Peas. Beets. Cauliflower. Cabbage. Broccoli. Carrots. Tomatoes. Squash. Eggplant. Herbs. Peppers. Onions. Cucumbers. Brussels sprouts. Grains Whole grains, such as whole-wheat or whole-grain breads, crackers, cereals, and pasta. Unsweetened oatmeal. Bulgur. Barley. Quinoa. Brown rice. Corn or whole-wheat flour tortillas or taco shells. Meats and other proteins Seafood. Poultry without skin. Lean cuts of pork and beef. Tofu. Eggs. Nuts. Beans. Dairy Low-fat or fat-free dairy products, such as yogurt, cottage cheese, and cheese. Beverages Water. Tea. Coffee. Sugar-free or diet soda. Seltzer water. Low-fat or nonfat milk. Milk alternatives, such as soy or almond milk. Fats and oils Olive oil. Canola oil. Sunflower oil. Grapeseed oil. Avocado. Walnuts. Sweets and desserts Sugar-free or low-fat pudding. Sugar-free or low-fat ice cream and other frozen treats. Seasonings and condiments Herbs. Sodium-free spices. Mustard. Relish. Low-salt, low-sugar ketchup. Low-salt, low-sugar barbecue sauce. Low-fat or fat-free mayonnaise. The items listed above may not be a  complete list of recommended foods and beverages. Contact a dietitian for more information. What foods are not recommended? Fruits Fruits canned with syrup. Vegetables Canned vegetables. Frozen vegetables with butter or cream sauce. Grains Refined white flour and flour products, such as bread, pasta, snack foods, and cereals. Meats and other proteins Fatty cuts of meat. Poultry with skin. Breaded or fried meat. Processed meats. Dairy Full-fat yogurt, cheese, or milk. Beverages Sweetened drinks, such as iced tea and soda. Fats and oils Butter. Lard. Ghee. Sweets and desserts Baked goods, such as cake, cupcakes, pastries, cookies, and cheesecake. Seasonings and condiments Spice mixes with added salt. Ketchup. Barbecue sauce. Mayonnaise. The items listed above may not be a complete list of foods and beverages that are not recommended. Contact a dietitian for more information. Where to find more information American Diabetes Association: www.diabetes.org Summary You may need to make diet and lifestyle changes to help prevent the onset of diabetes. These changes can help you control blood sugar, improve cholesterol levels, and manage blood pressure. Set weight loss goals with help from your health care team. It is recommended that most people with prediabetes lose 7% of their body weight. Consider following a Mediterranean diet. This includes eating plenty of fresh fruits and vegetables, whole grains, beans, nuts, seeds, fish, and low-fat dairy, and using olive oil instead of other fats. This information is not intended to replace advice given to you by your health care provider. Make sure you discuss any questions you have with your health care provider. Document Revised: 07/30/2019 Document Reviewed:  07/30/2019 Elsevier Patient Education  2022 [place] following Lifestyle Medicine recommendations according to [employer] of Lifestyle Medicine  [facility]) were discussed and  and offered to patient and she  agrees to start the journey:  A. Whole Foods, Plant-Based Nutrition comprising of fruits and vegetables, plant-based proteins, whole-grain carbohydrates was discussed in detail with the patient.   A list for source of those nutrients were also provided to the patient.  Patient will use only water or unsweetened tea for hydration. B.  The need to stay away from risky substances including alcohol, smoking; obtaining 7 to 9 hours of restorative sleep, at least 150 minutes of moderate intensity exercise weekly, the importance of healthy social connections,  and stress management techniques were discussed.  Handouts Provided Include  Lifestyle medicine Meal Plan Card Know your numbers  Learning Style & Readiness for Change Teaching method utilized: Visual & Auditory  Demonstrated degree of understanding via: Teach Back  Barriers to learning/adherence to lifestyle change: none  Goals Established by Pt Goals  Eat breakfast daily Drink 100 oz per day of water Eat 3 meals per day Focus on whole foods and plant based foods Exercise 150 minutes a week. Don't skip meals Get A1C to less than 5.7%   Prediabetes Eating Plan Prediabetes is a condition that causes blood sugar (glucose) levels to be higher than normal. This increases the risk for developing type 2 diabetes (type 2 diabetes mellitus). Working with a health care provider or nutrition specialist (dietitian) to make diet and lifestyle changes can help prevent the onset of diabetes. These changes may help you: Control your blood glucose levels. Improve your cholesterol levels. Manage your blood pressure. What are tips for following this plan? Reading food labels Read food labels to check the amount of fat, salt (sodium), and sugar in prepackaged foods. Avoid foods that have: Saturated fats. Trans fats. Added sugars. Avoid foods that have more than 300 milligrams (mg) of sodium per serving. Limit your  sodium intake to less than 2,300 mg each day. Shopping Avoid buying pre-made and processed foods. Avoid buying drinks with added sugar. Cooking Cook with olive oil. Do not use butter, lard, or ghee. Bake, broil, grill, steam, or boil foods. Avoid frying. Meal planning    MONITORING & EVALUATION Dietary intake, weekly physical activity, and meal planning in 3 months.  Next Steps  Patient is to work on consistent meals and exercise.

## 2021-07-19 NOTE — Patient Instructions (Addendum)
Goals ? ?Eat breakfast daily ?Drink 100 oz per day of water ?Eat 3 meals per day ?Focus on whole foods and plant based foods ?Exercise 150 minutes a week. ?Don't skip meals ?Get A1C to less than 5.7%  ? ?Prediabetes Eating Plan ?Prediabetes is a condition that causes blood sugar (glucose) levels to be higher than normal. This increases the risk for developing type 2 diabetes (type 2 diabetes mellitus). Working with a health care provider or nutrition specialist (dietitian) to make diet and lifestyle changes can help prevent the onset of diabetes. These changes may help you: ?Control your blood glucose levels. ?Improve your cholesterol levels. ?Manage your blood pressure. ?What are tips for following this plan? ?Reading food labels ?Read food labels to check the amount of fat, salt (sodium), and sugar in prepackaged foods. Avoid foods that have: ?Saturated fats. ?Trans fats. ?Added sugars. ?Avoid foods that have more than 300 milligrams (mg) of sodium per serving. Limit your sodium intake to less than 2,300 mg each day. ?Shopping ?Avoid buying pre-made and processed foods. ?Avoid buying drinks with added sugar. ?Cooking ?Cook with olive oil. Do not use butter, lard, or ghee. ?Bake, broil, grill, steam, or boil foods. Avoid frying. ?Meal planning ? ?Work with your dietitian to create an eating plan that is right for you. This may include tracking how many calories you take in each day. Use a food diary, notebook, or mobile application to track what you eat at each meal. ?Consider following a Mediterranean diet. This includes: ?Eating several servings of fresh fruits and vegetables each day. ?Eating fish at least twice a week. ?Eating one serving each day of whole grains, beans, nuts, and seeds. ?Using olive oil instead of other fats. ?Limiting alcohol. ?Limiting red meat. ?Using nonfat or low-fat dairy products. ?Consider following a plant-based diet. This includes dietary choices that focus on eating mostly  vegetables and fruit, grains, beans, nuts, and seeds. ?If you have high blood pressure, you may need to limit your sodium intake or follow a diet such as the DASH (Dietary Approaches to Stop Hypertension) eating plan. The DASH diet aims to lower high blood pressure. ?Lifestyle ?Set weight loss goals with help from your health care team. It is recommended that most people with prediabetes lose 7% of their body weight. ?Exercise for at least 30 minutes 5 or more days a week. ?Attend a support group or seek support from a mental health counselor. ?Take over-the-counter and prescription medicines only as told by your health care provider. ?What foods are recommended? ?Fruits ?Berries. Bananas. Apples. Oranges. Grapes. Papaya. Mango. Pomegranate. Kiwi. Grapefruit. Cherries. ?Vegetables ?Lettuce. Spinach. Peas. Beets. Cauliflower. Cabbage. Broccoli. Carrots. Tomatoes. Squash. Eggplant. Herbs. Peppers. Onions. Cucumbers. Brussels sprouts. ?Grains ?Whole grains, such as whole-wheat or whole-grain breads, crackers, cereals, and pasta. Unsweetened oatmeal. Bulgur. Barley. Quinoa. Brown rice. Corn or whole-wheat flour tortillas or taco shells. ?Meats and other proteins ?Seafood. Poultry without skin. Lean cuts of pork and beef. Tofu. Eggs. Nuts. Beans. ?Dairy ?Low-fat or fat-free dairy products, such as yogurt, cottage cheese, and cheese. ?Beverages ?Water. Tea. Coffee. Sugar-free or diet soda. Seltzer water. Low-fat or nonfat milk. Milk alternatives, such as soy or almond milk. ?Fats and oils ?Olive oil. Canola oil. Sunflower oil. Grapeseed oil. Avocado. Walnuts. ?Sweets and desserts ?Sugar-free or low-fat pudding. Sugar-free or low-fat ice cream and other frozen treats. ?Seasonings and condiments ?Herbs. Sodium-free spices. Mustard. Relish. Low-salt, low-sugar ketchup. Low-salt, low-sugar barbecue sauce. Low-fat or fat-free mayonnaise. ?The items listed above may not be  a complete list of recommended foods and beverages.  Contact a dietitian for more information. ?What foods are not recommended? ?Fruits ?Fruits canned with syrup. ?Vegetables ?Canned vegetables. Frozen vegetables with butter or cream sauce. ?Grains ?Refined white flour and flour products, such as bread, pasta, snack foods, and cereals. ?Meats and other proteins ?Fatty cuts of meat. Poultry with skin. Breaded or fried meat. Processed meats. ?Dairy ?Full-fat yogurt, cheese, or milk. ?Beverages ?Sweetened drinks, such as iced tea and soda. ?Fats and oils ?Butter. Lard. Ghee. ?Sweets and desserts ?Baked goods, such as cake, cupcakes, pastries, cookies, and cheesecake. ?Seasonings and condiments ?Spice mixes with added salt. Ketchup. Barbecue sauce. Mayonnaise. ?The items listed above may not be a complete list of foods and beverages that are not recommended. Contact a dietitian for more information. ?Where to find more information ?American Diabetes Association: www.diabetes.org ?Summary ?You may need to make diet and lifestyle changes to help prevent the onset of diabetes. These changes can help you control blood sugar, improve cholesterol levels, and manage blood pressure. ?Set weight loss goals with help from your health care team. It is recommended that most people with prediabetes lose 7% of their body weight. ?Consider following a Mediterranean diet. This includes eating plenty of fresh fruits and vegetables, whole grains, beans, nuts, seeds, fish, and low-fat dairy, and using olive oil instead of other fats. ?This information is not intended to replace advice given to you by your health care provider. Make sure you discuss any questions you have with your health care provider. ?Document Revised: 07/30/2019 Document Reviewed: 07/30/2019 ?Elsevier Patient Education ? 2022 [place].  ? ? ? ? ?

## 2021-10-18 IMAGING — US US OB < 14 WEEKS - US OB TV
1 series · 15 of 28 positions shown · non-contrast
Comparison: No prior scans from this gestation.

CLINICAL DATA: 44-year-old pregnant female presents with vaginal
bleeding. Quantitative beta HCG pending.

EDC by LMP: 10/06/2020, projecting to an expected gestational age of
7 weeks 6 days.
EXAM:
OBSTETRIC <14 WK US AND TRANSVAGINAL OB US
TECHNIQUE: Both transabdominal and transvaginal ultrasound examinations were
performed for complete evaluation of the gestation as well as the
maternal uterus, adnexal regions, and pelvic cul-de-sac.
Transvaginal technique was performed to assess early pregnancy.

[Series 1: us ob < 14 weeks - us ob tv · 15 of 46 slices shown]
[im 1/46]
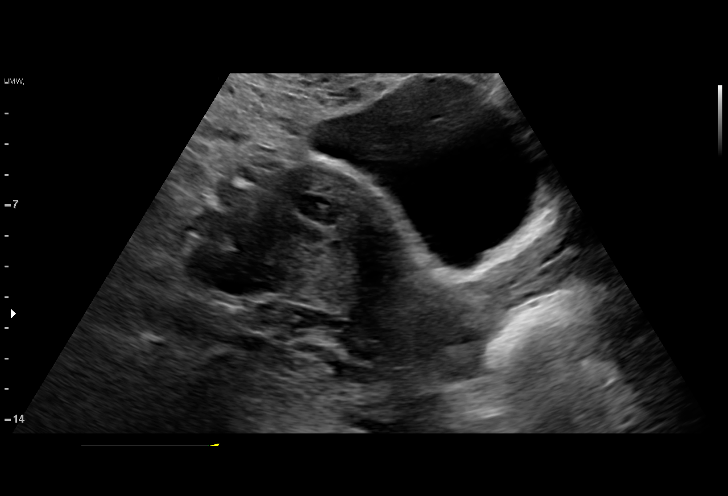
[im 4/46]
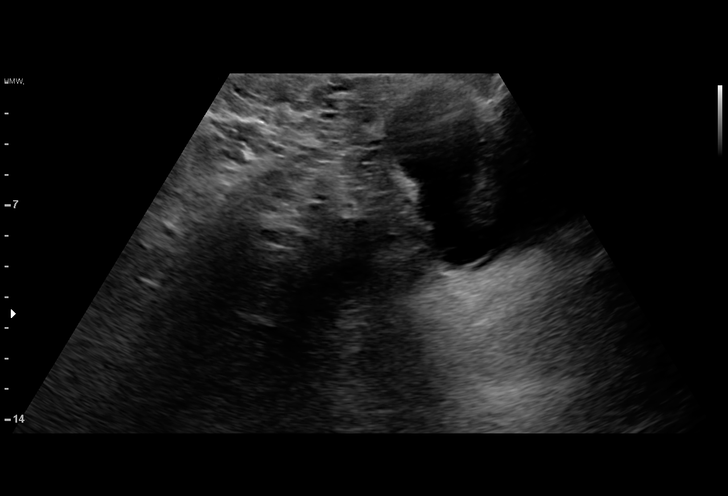
[im 7/46]
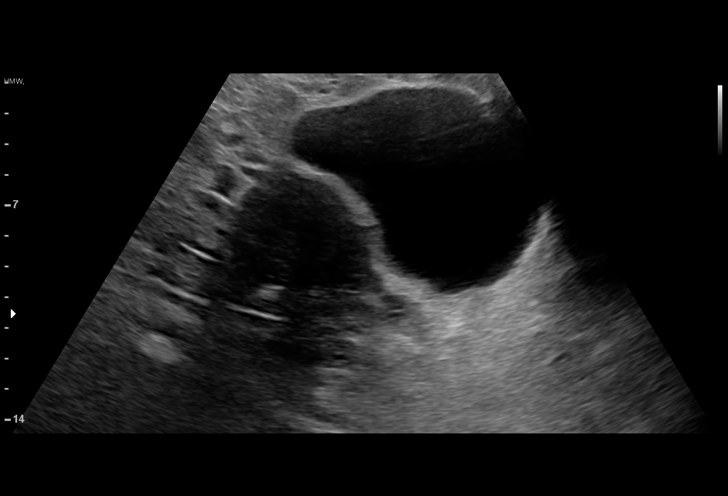
[im 11/46]
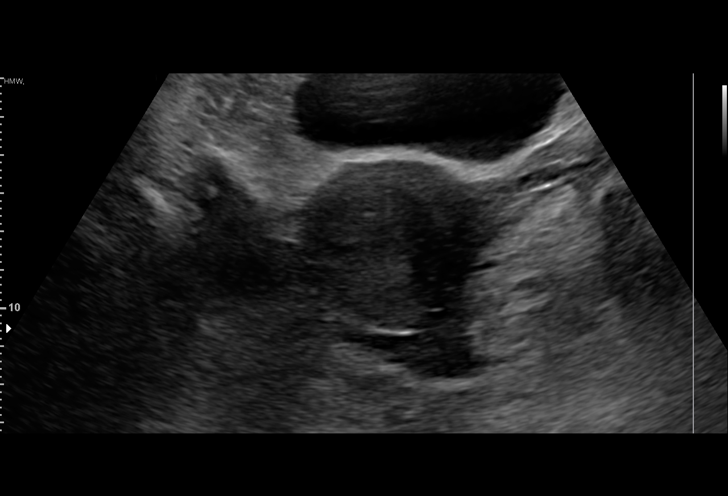
[im 14/46]
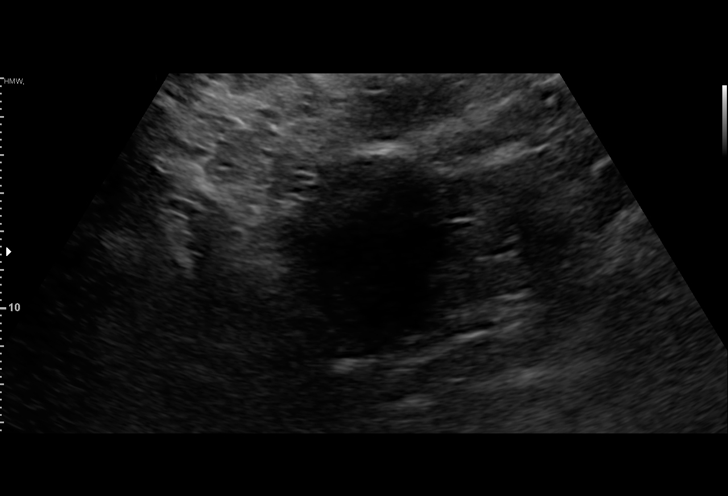
[im 17/46]
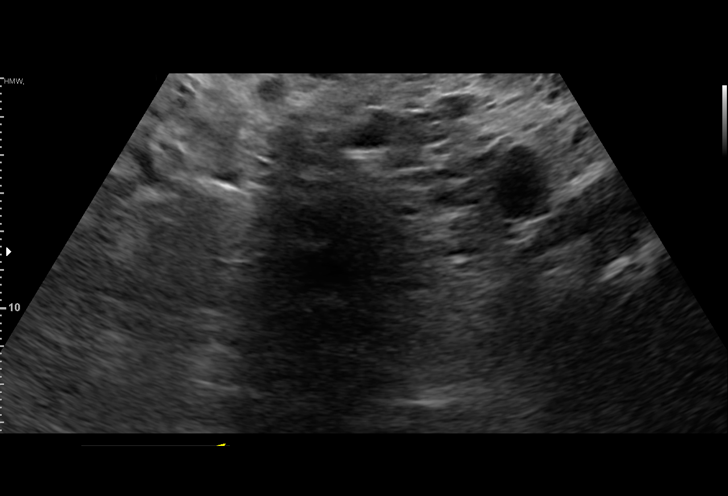
[im 21/46]
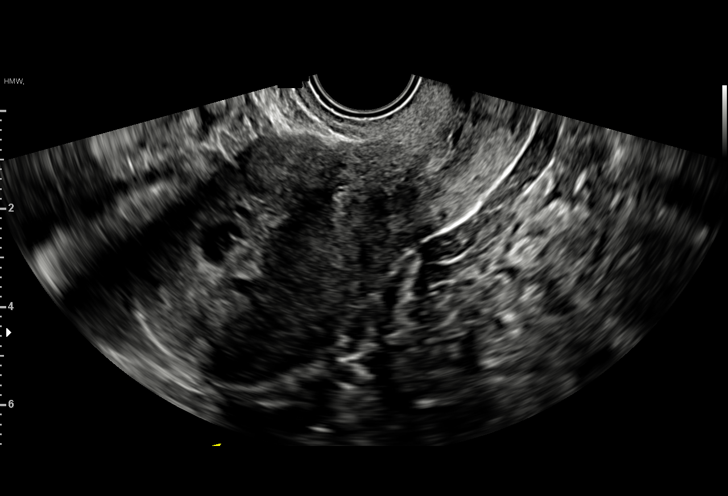
[im 24/46]
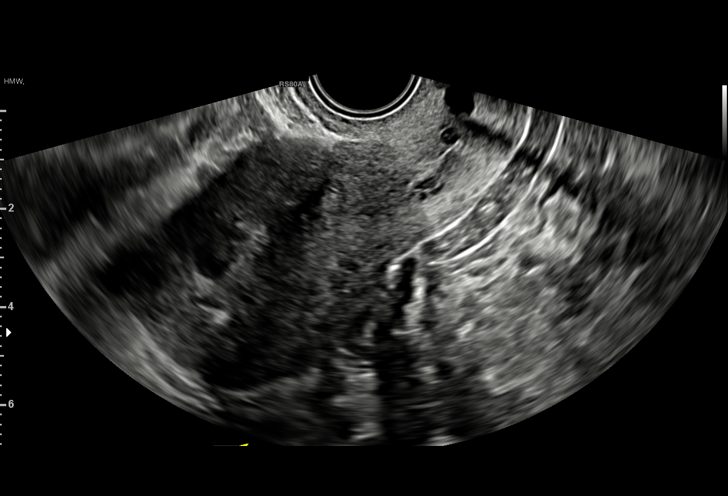
[im 26/46]
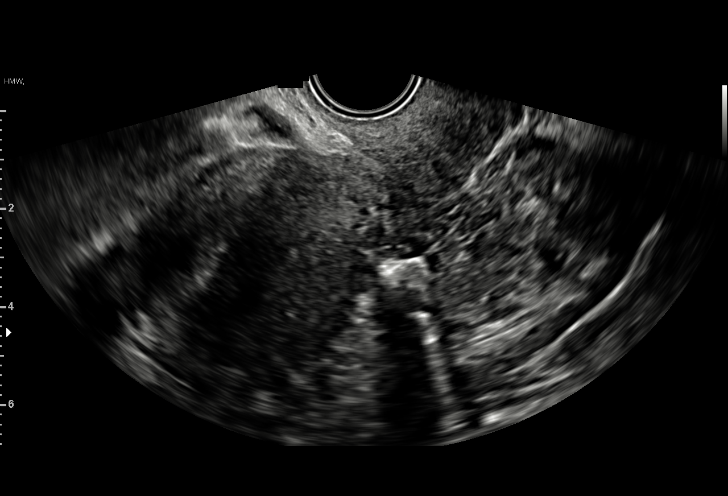
[im 29/46]
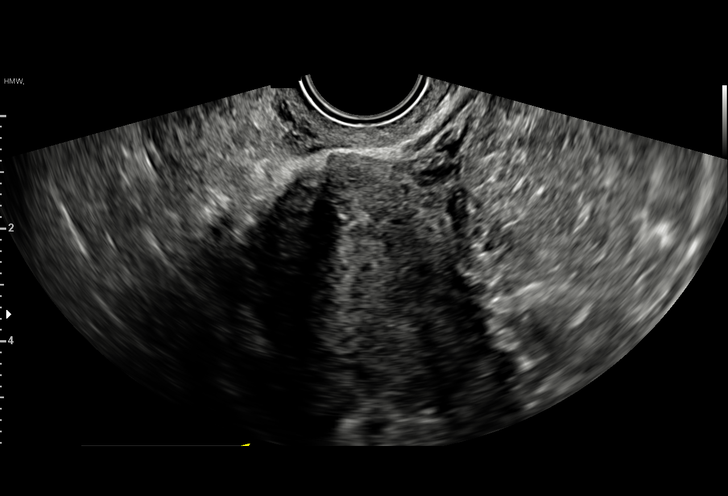
[im 32/46]
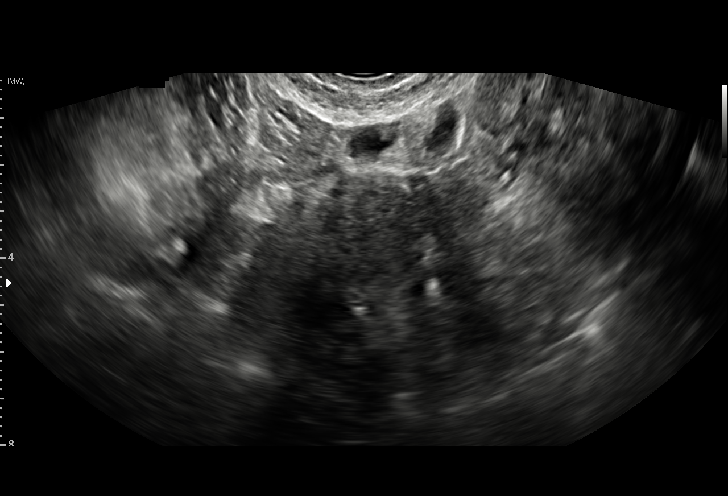
[im 36/46]
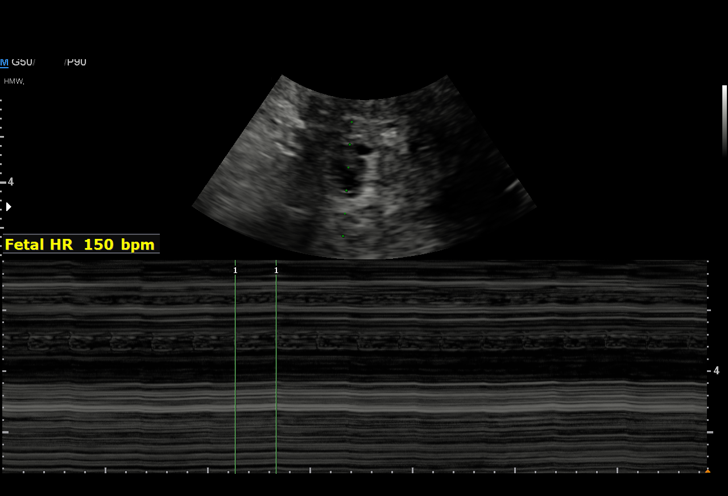
[im 39/46]
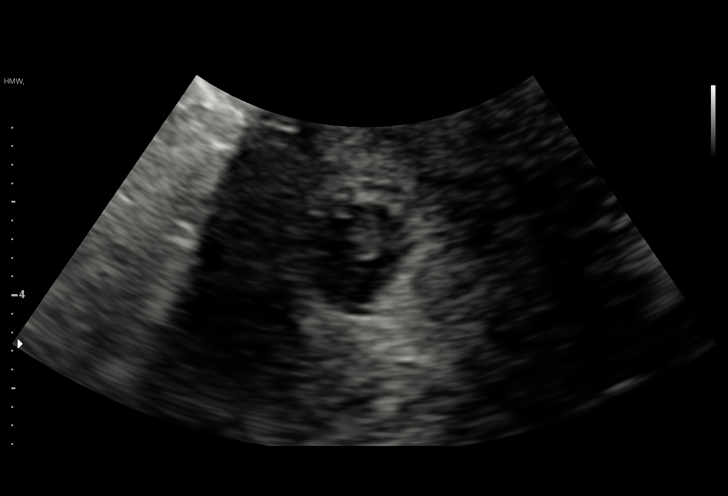
[im 42/46]
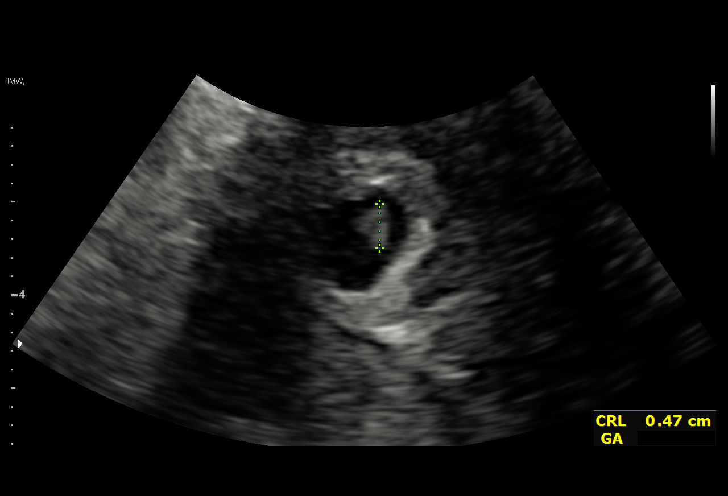
[im 46/46]
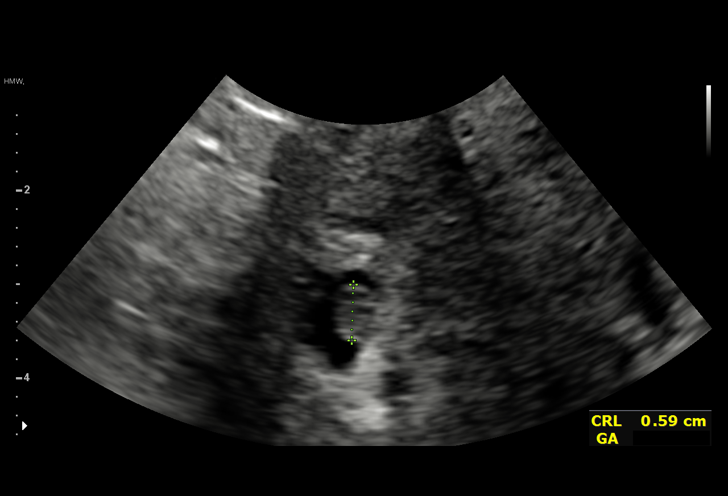

[15 of 28 positions shown; findings below may reference images not displayed]

FINDINGS: Intrauterine gestational sac: Single

Yolk sac:  Visualized.

Embryo:  Visualized.

Cardiac Activity: Visualized.

Heart Rate: 150 bpm

CRL:  5.3 mm   6 w   1 d                  US EDC: 10/18/2020

Subchorionic hemorrhage:  None visualized.

Maternal uterus/adnexae: Anteverted uterus with no uterine fibroids.
Nonvisualization of the ovaries bilaterally. No adnexal masses. No
abnormal free fluid in the pelvis.
IMPRESSION: 1. Single living intrauterine gestation at 6 weeks 1 day by
crown-rump length, discordant with the expected gestational age of 7
weeks 6 days by provided menstrual dating. Normal embryonic cardiac
activity. Suggest follow-up obstetric scan in 1 month to assess for
appropriate fetal growth.
2. Nonvisualization of the ovaries bilaterally.  No adnexal masses.

## 2021-10-25 ENCOUNTER — Ambulatory Visit: Payer: 59 | Admitting: Nutrition

## 2023-02-18 IMAGING — CT CT ABD-PELV W/ CM
2 of 5 series · 16 of 46 positions shown, 18 images · IV contrast (Omnipaque)
Comparison: 05/05/2013

CLINICAL DATA: Nonlocalized acute abdominal pain

EXAM:
CT ABDOMEN AND PELVIS WITH CONTRAST
TECHNIQUE: Multidetector CT imaging of the abdomen and pelvis was performed
using the standard protocol following bolus administration of
intravenous contrast.

[Series 2: axial st · axial · 0.98mm/px · z∈[-598,-198]mm · 13 of 92 slices shown, 15 images]
[im 6/92  soft-tissue]
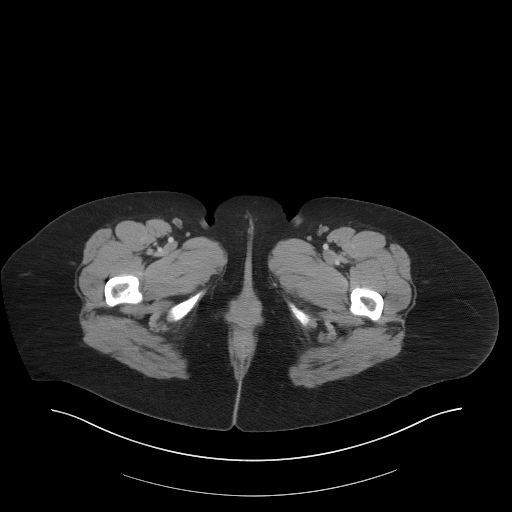
[im 6/92  bone]
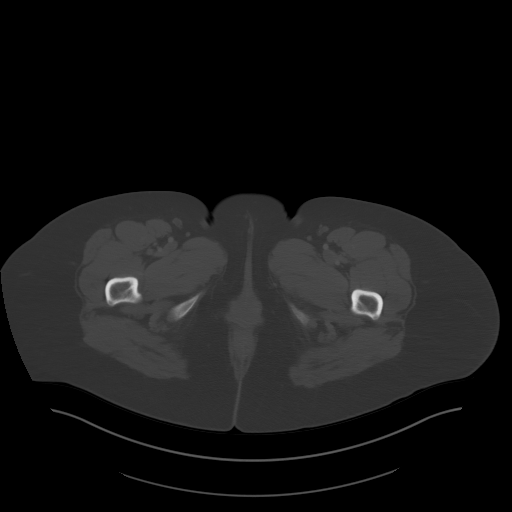
[im 11/92  soft-tissue]
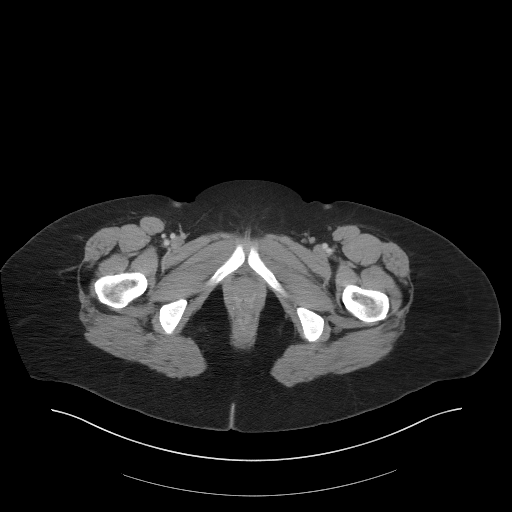
[im 21/92  soft-tissue]
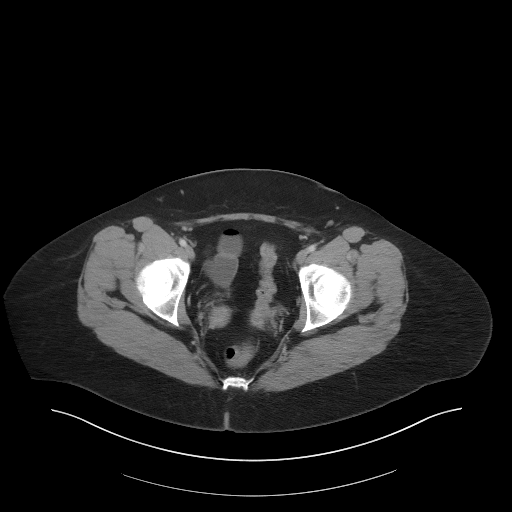
[im 26/92  soft-tissue]
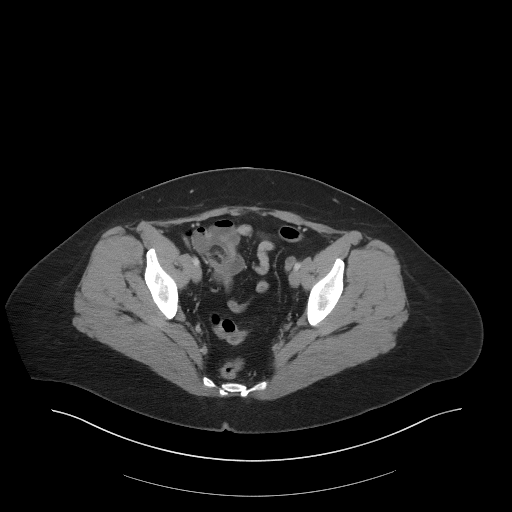
[im 31/92  soft-tissue]
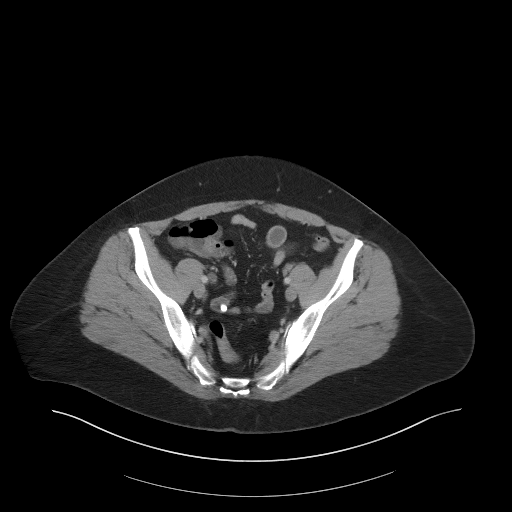
[im 41/92  soft-tissue]
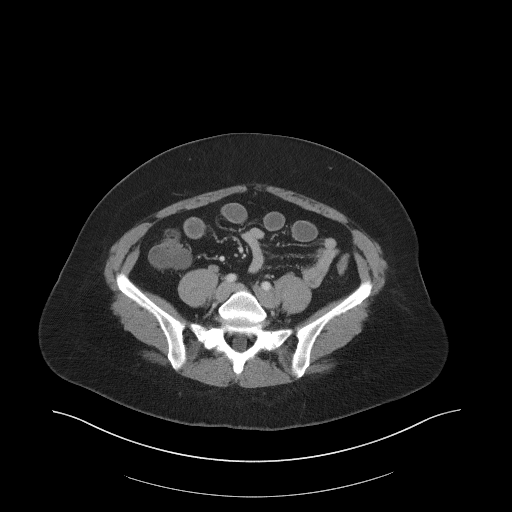
[im 46/92  soft-tissue]
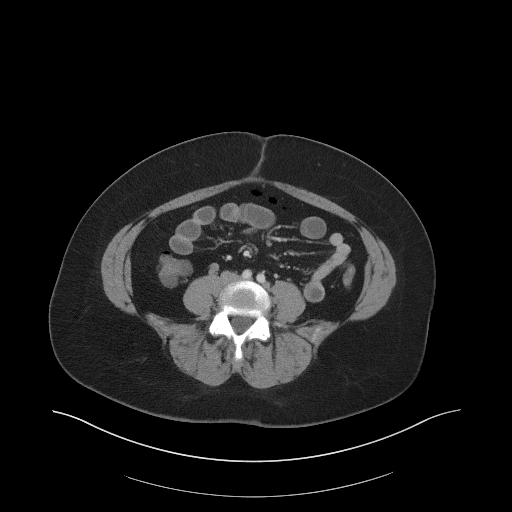
[im 51/92  soft-tissue]
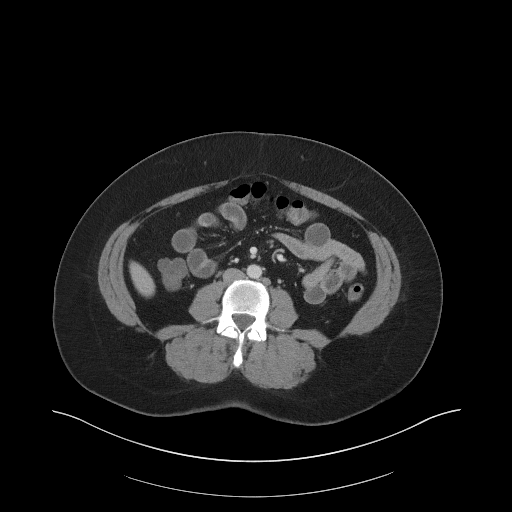
[im 61/92  soft-tissue]
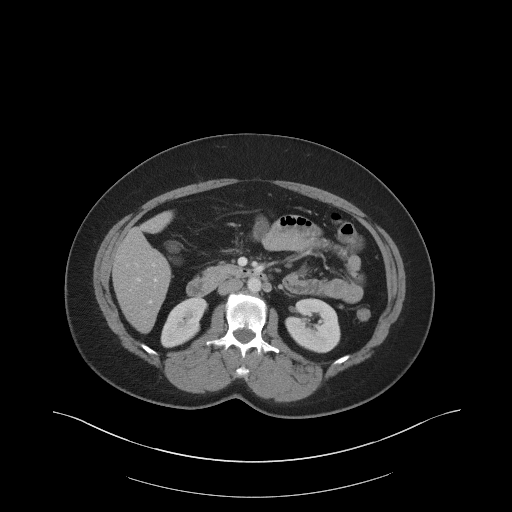
[im 61/92  bone]
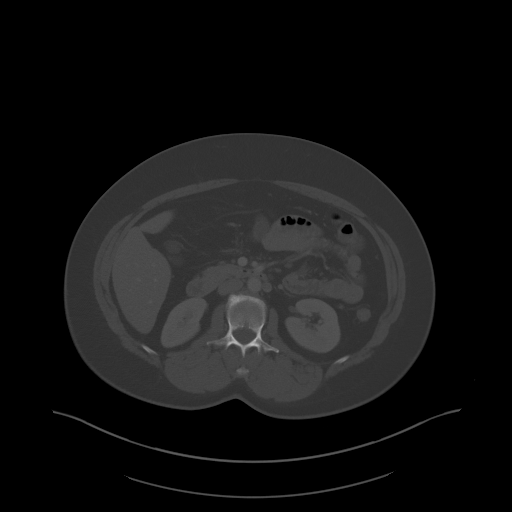
[im 66/92  soft-tissue]
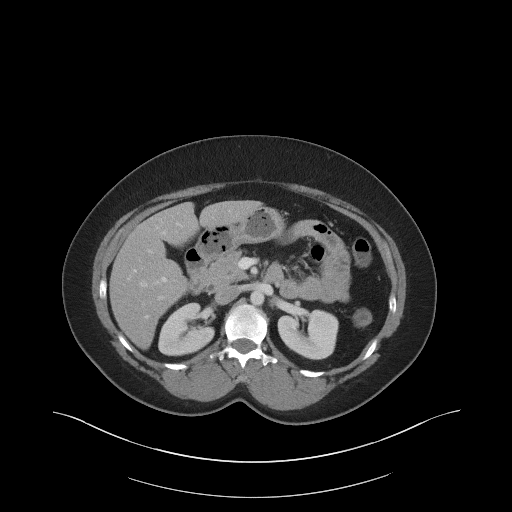
[im 71/92  soft-tissue]
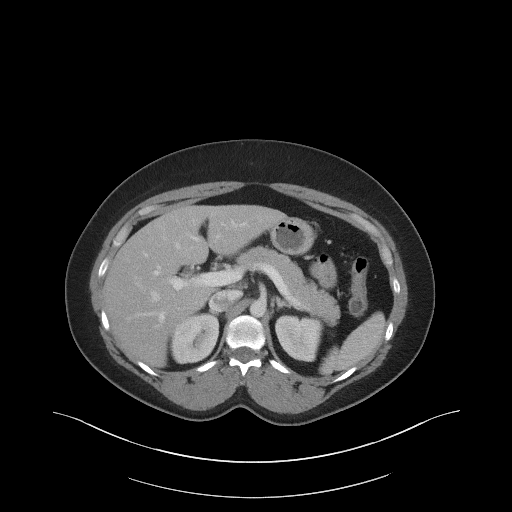
[im 81/92  soft-tissue]
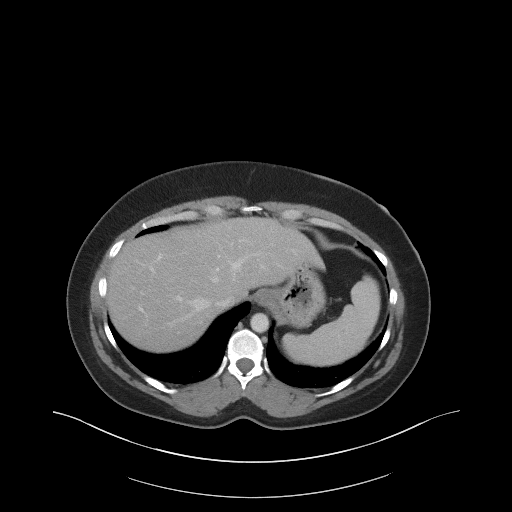
[im 86/92  soft-tissue]
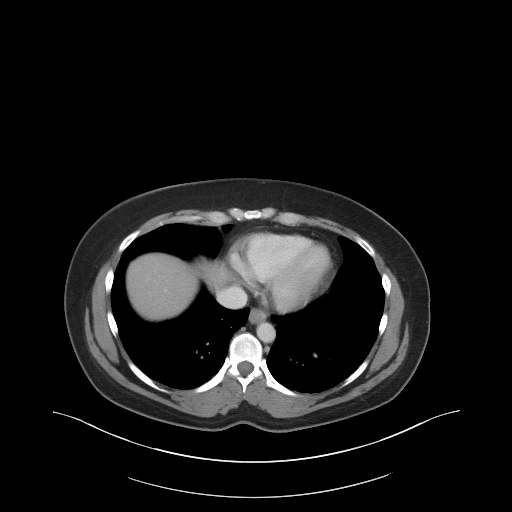

[Series 5: coronal st · coronal · 0.87mm/px · 3 of 116 slices shown]
[im 39/116  soft-tissue]
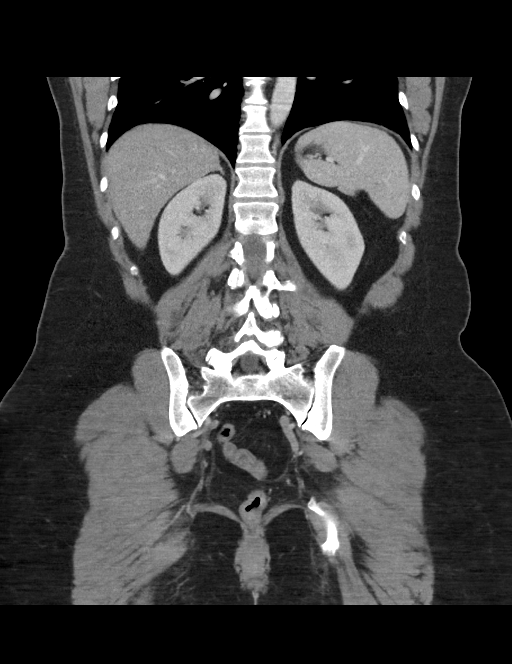
[im 52/116  soft-tissue]
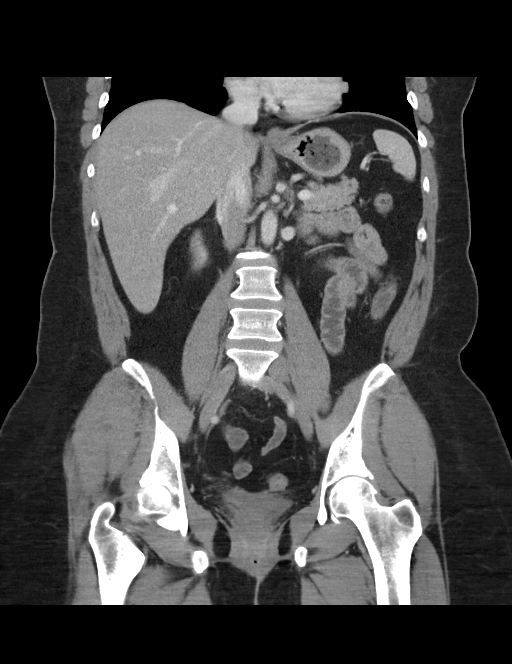
[im 64/116  soft-tissue]
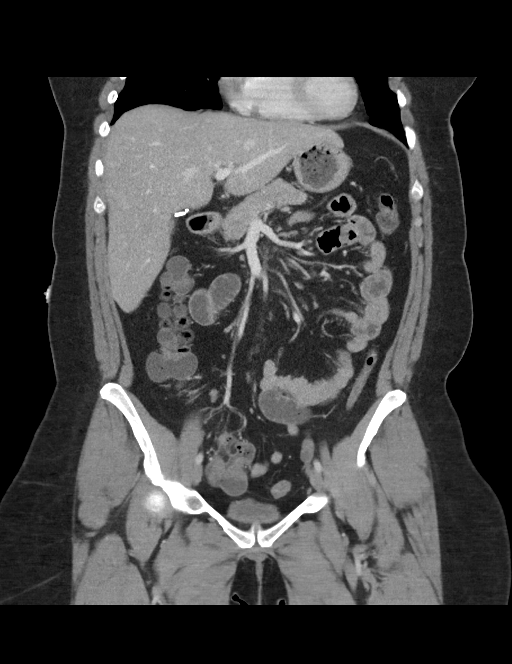

[16 of 46 positions shown; findings below may reference images not displayed]

RADIATION DOSE REDUCTION: This exam was performed according to the
departmental dose-optimization program which includes automated
exposure control, adjustment of the mA and/or kV according to
patient size and/or use of iterative reconstruction technique.

CONTRAST:  100mL OMNIPAQUE IOHEXOL 300 MG/ML  SOLN
FINDINGS: LOWER CHEST: Normal.

HEPATOBILIARY: Normal hepatic contours. No intra- or extrahepatic
biliary dilatation. Status post cholecystectomy.

PANCREAS: Normal pancreas. No ductal dilatation or peripancreatic
fluid collection.

SPLEEN: Normal.

ADRENALS/URINARY TRACT: The adrenal glands are normal. No
hydronephrosis, nephroureterolithiasis or solid renal mass. The
urinary bladder is normal for degree of distention

STOMACH/BOWEL: There is no hiatal hernia. Normal duodenal course and
caliber. No small bowel dilatation or inflammation. No focal colonic
abnormality. The appendix is not visualized. No right lower quadrant
inflammation or free fluid.

VASCULAR/LYMPHATIC: Normal course and caliber of the major abdominal
vessels. No abdominal or pelvic lymphadenopathy.

REPRODUCTIVE: Status post hysterectomy. No adnexal mass.

MUSCULOSKELETAL. No bony spinal canal stenosis or focal osseous
abnormality.

OTHER: None.
IMPRESSION: No acute abnormality of the abdomen or pelvis.

## 2023-09-12 ENCOUNTER — Encounter (HOSPITAL_BASED_OUTPATIENT_CLINIC_OR_DEPARTMENT_OTHER): Payer: Self-pay | Admitting: Internal Medicine

## 2023-09-12 DIAGNOSIS — G4710 Hypersomnia, unspecified: Secondary | ICD-10-CM

## 2023-09-12 DIAGNOSIS — R5383 Other fatigue: Secondary | ICD-10-CM

## 2023-09-12 DIAGNOSIS — R0683 Snoring: Secondary | ICD-10-CM

## 2023-09-12 DIAGNOSIS — R0681 Apnea, not elsewhere classified: Secondary | ICD-10-CM

## 2023-12-17 NOTE — Procedures (Signed)
 Orders only
